# Patient Record
Sex: Female | Born: 1955
Health system: Southern US, Community
[De-identification: ages and names within clinical notes are randomized; demographics above are authoritative.]

## PROBLEM LIST (undated history)

## (undated) DIAGNOSIS — N390 Urinary tract infection, site not specified: Secondary | ICD-10-CM

## (undated) DIAGNOSIS — H04123 Dry eye syndrome of bilateral lacrimal glands: Secondary | ICD-10-CM

## (undated) DIAGNOSIS — B019 Varicella without complication: Secondary | ICD-10-CM

## (undated) DIAGNOSIS — M2550 Pain in unspecified joint: Secondary | ICD-10-CM

## (undated) DIAGNOSIS — IMO0002 Reserved for concepts with insufficient information to code with codable children: Secondary | ICD-10-CM

## (undated) DIAGNOSIS — E0500 Thyrotoxicosis with diffuse goiter without thyrotoxic crisis or storm: Secondary | ICD-10-CM

## (undated) DIAGNOSIS — R002 Palpitations: Secondary | ICD-10-CM

## (undated) DIAGNOSIS — I499 Cardiac arrhythmia, unspecified: Secondary | ICD-10-CM

## (undated) DIAGNOSIS — E039 Hypothyroidism, unspecified: Secondary | ICD-10-CM

## (undated) DIAGNOSIS — E079 Disorder of thyroid, unspecified: Secondary | ICD-10-CM

## (undated) DIAGNOSIS — M797 Fibromyalgia: Secondary | ICD-10-CM

## (undated) HISTORY — DX: Thyrotoxicosis with diffuse goiter without thyrotoxic crisis or storm: E05.00

## (undated) HISTORY — PX: WISDOM TOOTH EXTRACTION: SHX21

## (undated) HISTORY — DX: Urinary tract infection, site not specified: N39.0

## (undated) HISTORY — DX: Reserved for concepts with insufficient information to code with codable children: IMO0002

## (undated) HISTORY — DX: Varicella without complication: B01.9

## (undated) HISTORY — DX: Palpitations: R00.2

## (undated) HISTORY — DX: Fibromyalgia: M79.7

## (undated) HISTORY — DX: Pain in unspecified joint: M25.50

## (undated) HISTORY — DX: Dry eye syndrome of bilateral lacrimal glands: H04.123

## (undated) HISTORY — DX: Disorder of thyroid, unspecified: E07.9

## (undated) HISTORY — DX: Cardiac arrhythmia, unspecified: I49.9

## (undated) HISTORY — DX: Hypothyroidism, unspecified: E03.9

---

## 2007-12-18 LAB — HM COLONOSCOPY: HM Colonoscopy: NORMAL

## 2009-12-17 LAB — HM MAMMOGRAPHY: HM Mammogram: NORMAL

## 2011-12-18 LAB — HM PAP SMEAR: HM Pap smear: NORMAL

## 2013-09-10 ENCOUNTER — Encounter: Payer: Self-pay | Admitting: Family Medicine

## 2013-09-10 ENCOUNTER — Ambulatory Visit (INDEPENDENT_AMBULATORY_CARE_PROVIDER_SITE_OTHER): Payer: 59 | Admitting: Family Medicine

## 2013-09-10 VITALS — BP 100/78 | Temp 98.2°F | Ht 68.0 in | Wt 167.0 lb

## 2013-09-10 DIAGNOSIS — H04123 Dry eye syndrome of bilateral lacrimal glands: Secondary | ICD-10-CM

## 2013-09-10 DIAGNOSIS — IMO0001 Reserved for inherently not codable concepts without codable children: Secondary | ICD-10-CM

## 2013-09-10 DIAGNOSIS — E039 Hypothyroidism, unspecified: Secondary | ICD-10-CM

## 2013-09-10 DIAGNOSIS — M797 Fibromyalgia: Secondary | ICD-10-CM

## 2013-09-10 DIAGNOSIS — R002 Palpitations: Secondary | ICD-10-CM

## 2013-09-10 DIAGNOSIS — H04129 Dry eye syndrome of unspecified lacrimal gland: Secondary | ICD-10-CM

## 2013-09-10 HISTORY — DX: Fibromyalgia: M79.7

## 2013-09-10 HISTORY — DX: Palpitations: R00.2

## 2013-09-10 HISTORY — DX: Dry eye syndrome of bilateral lacrimal glands: H04.123

## 2013-09-10 HISTORY — DX: Hypothyroidism, unspecified: E03.9

## 2013-09-10 NOTE — Progress Notes (Signed)
Pre visit review using our clinic review tool, if applicable. No additional management support is needed unless otherwise documented below in the visit note. 

## 2013-09-10 NOTE — Progress Notes (Signed)
Chief Complaint  Patient presents with  . Establish Care    HPI:  Brittany Moran is here to establish care. Recently moved here from [place]. Last PCP and physical: has had female physical in last few years  Hx of graves, polyarthralgia and numbness in legs: -was seeing and integrative medicine doctor in [place] most recently -dx with graves in 2000 and had RAI ablation -was on synthroid for a long time then put on cytomel too 1 year ago by integrative medical doctor and she doesn't know if needs to take this and only taking it occ -7 years ago had polyarthralgia and had random numbness in L leg that stopped her marathon running and she saw rheumatologist, sports medicine and neurologist without help -went to [facility] in [place] in 2.2013  - saw internist whom gave dx of "central sensitization disorder" and was referred to physiatrist and was told has fibromyalgia and was started on savella which had helped the pain some -saw documentary "forks over knives"  and went to plant based diet about 6 months ago and now is doing great with pain gone and is thinking about restarting exercising -has been on stable dose of synthroid for a long time -she has been weaning off the cytomel and the sevella and is hardly taking and feels great  Has the following chronic problems and concerns today:  Patient Active Problem List   Diagnosis Date Noted  . Hypothyroidism, acquired 09/10/2013  . Palpitations 09/10/2013  . Fibromyalgia 09/10/2013  . Dry eyes 09/10/2013   Health Maintenance: -needs health maintenance physical  ROS: See pertinent positives and negatives per HPI.  Past Medical History  Diagnosis Date  . Chicken pox   . Cardiac arrhythmia   . Thyroid disease   . UTI (urinary tract infection)   . Graves disease   . Polyarthralgia   . DDD (degenerative disc disease)     Family History  Problem Relation Age of Onset  . Arthritis Mother   . Hyperlipidemia Mother   . Heart disease  Father   . Sudden death Maternal Grandmother   . Sudden death Paternal Grandfather   . Hypertension Mother   . Bipolar disorder Father   . Schizophrenia Brother   . Depression      brothers and sisters   . Diabetes Mother     History   Social History  . Marital Status: Married    Spouse Name: N/A    Number of Children: N/A  . Years of Education: N/A   Social History Main Topics  . Smoking status: Never Smoker   . Smokeless tobacco: None  . Alcohol Use: Yes     Comment: socially   . Drug Use: None  . Sexual Activity: None   Other Topics Concern  . 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 4 times a week,  Disp: , Rfl: ;  cycloSPORINE (RESTASIS) 0.05 % ophthalmic emulsion, 1 drop 2 (two) times daily., Disp: , Rfl:  Ginger, Zingiber officinalis, (GINGER PO), Take by mouth daily., Disp: , Rfl: ;  levothyroxine (SYNTHROID, LEVOTHROID) 125 MCG tablet, Take 125 mcg by mouth daily before breakfast., Disp: , Rfl: ;  liothyronine (CYTOMEL) 25 MCG tablet, Take by mouth daily., Disp: , Rfl: ;  Milnacipran (SAVELLA) 50 MG TABS tablet, Take 50 mg by mouth 2 (two) times daily., Disp: , Rfl:  Multiple Vitamins-Minerals (ZINC PO), Take by mouth. 3 to 4 times a week, Disp: , Rfl:   EXAM:  Filed Vitals:   09/10/13 1616  BP: 100/78  Temp: 98.2 F (36.8 C)    Body mass index is 25.4 kg/(m^2).  GENERAL: vitals reviewed and listed above, alert, oriented, appears well hydrated and in no acute  distress  HEENT: atraumatic, conjunttiva clear, no obvious abnormalities on inspection of external nose and ears  NECK: no obvious masses on inspection  LUNGS: clear to auscultation bilaterally, no wheezes, rales or rhonchi, good air movement  CV: HRRR, no peripheral edema  MS: moves all extremities without noticeable abnormality  PSYCH: pleasant and cooperative, no obvious depression or anxiety  ASSESSMENT AND PLAN:  Discussed the following assessment and plan:  Hypothyroidism, acquired -is going to wean off cytomel and check thyroid labs at physical  Palpitations  Fibromyalgia -is going to wean of savella   Dry eyes  -We reviewed the PMH, PSH, FH, SH, Meds and Allergies. -We provided refills for any medications we will prescribe as needed. -We addressed current concerns per orders and patient instructions. -We have asked for records for pertinent exams, studies, vaccines and notes from previous providers. -We have advised patient to follow up per instructions below. -follow up for physical and labs in 3 months  -Patient advised to return or notify a doctor immediately if symptoms worsen or persist or new concerns arise.  Patient Instructions  -We have ordered labs or studies at this visit. It can take up to 1-2 weeks for results and processing. We will contact you with instructions IF your results are abnormal. Normal results will be released to your [facility]. If you have not heard from us or can not find your results in [facility] in 2 weeks please contact our office.  -PLEASE SIGN UP FOR MYCHART TODAY   We recommend the following healthy lifestyle measures: - eat a healthy diet consisting of lots of vegetables, fruits, beans, nuts, seeds, healthy meats such as white chicken and fish and whole grains.  - avoid fried foods, fast food, processed foods, sodas, red meet and other fattening foods.  - get a least 150 minutes of aerobic exercise per week.   Follow up in: 3  months for physical and we will do labs that morning       KIM, HANNAH R.

## 2013-09-10 NOTE — Patient Instructions (Signed)
-  We have ordered labs or studies at this visit. It can take up to 1-2 weeks for results and processing. We will contact you with instructions IF your results are abnormal. Normal results will be released to your [facility]. If you have not heard from [place] or can not find your results in [facility] in 2 weeks please contact our office.  -PLEASE SIGN UP FOR MYCHART TODAY   We recommend the following healthy lifestyle measures: - eat a healthy diet consisting of lots of vegetables, fruits, beans, nuts, seeds, healthy meats such as white chicken and fish and whole grains.  - avoid fried foods, fast food, processed foods, sodas, red meet and other fattening foods.  - get a least 150 minutes of aerobic exercise per week.   Follow up in: 3 months for physical and we will do labs that morning

## 2013-10-18 ENCOUNTER — Telehealth: Payer: Self-pay

## 2013-10-18 MED ORDER — LEVOTHYROXINE SODIUM 125 MCG PO TABS: 125.0000 ug | ORAL_TABLET | Freq: Every day | ORAL | Status: DC

## 2013-10-18 NOTE — Telephone Encounter (Signed)
Pt is req refill on levothyroxine (SYNTHROID, LEVOTHROID) 125 MCG tablet  She said she can not take the generic levothyroxine she has to have synthroid  ///   Pharmacy is [facility] on [address]

## 2013-10-18 NOTE — Telephone Encounter (Signed)
Rx sent for 90 days; pt has upcoming appointment next month.

## 2013-12-03 ENCOUNTER — Ambulatory Visit: Payer: 59 | Admitting: Family Medicine

## 2014-01-22 ENCOUNTER — Other Ambulatory Visit: Payer: Self-pay | Admitting: Family Medicine

## 2014-01-28 ENCOUNTER — Telehealth: Payer: Self-pay | Admitting: Family Medicine

## 2014-01-28 MED ORDER — LEVOTHYROXINE SODIUM 125 MCG PO TABS: 125.0000 ug | ORAL_TABLET | Freq: Every day | ORAL | Status: DC

## 2014-01-28 NOTE — Telephone Encounter (Signed)
Pt is needing new rx for levothyroxine (SYNTHROID, LEVOTHROID) 125 MCG tablet Send to [facility] [address]

## 2014-02-12 ENCOUNTER — Encounter: Payer: Self-pay | Admitting: Family Medicine

## 2014-02-12 ENCOUNTER — Ambulatory Visit (INDEPENDENT_AMBULATORY_CARE_PROVIDER_SITE_OTHER): Payer: 59 | Admitting: Family Medicine

## 2014-02-12 ENCOUNTER — Encounter: Payer: Self-pay | Admitting: *Deleted

## 2014-02-12 VITALS — BP 104/80 | HR 63 | Temp 98.1°F | Ht 68.25 in | Wt 162.0 lb

## 2014-02-12 DIAGNOSIS — IMO0001 Reserved for inherently not codable concepts without codable children: Secondary | ICD-10-CM

## 2014-02-12 DIAGNOSIS — M797 Fibromyalgia: Secondary | ICD-10-CM

## 2014-02-12 DIAGNOSIS — Z0000 Encounter for general adult medical examination without abnormal findings: Secondary | ICD-10-CM

## 2014-02-12 DIAGNOSIS — E039 Hypothyroidism, unspecified: Secondary | ICD-10-CM

## 2014-02-12 DIAGNOSIS — R002 Palpitations: Secondary | ICD-10-CM

## 2014-02-12 LAB — LIPID PANEL
CHOL/HDL RATIO: 3
Cholesterol: 187 mg/dL (ref 0–200)
HDL: 71.4 mg/dL (ref >39.00)
LDL CALC: 105 mg/dL — AB (ref 0–99)
NonHDL: 115.6
TRIGLYCERIDES: 55 mg/dL (ref 0.0–149.0)
VLDL: 11 mg/dL (ref 0.0–40.0)

## 2014-02-12 LAB — HEMOGLOBIN A1C: Hgb A1c MFr Bld: 5.1 % (ref 4.6–6.5)

## 2014-02-12 LAB — TSH: TSH: 0.61 u[IU]/mL (ref 0.35–4.50)

## 2014-02-12 NOTE — Patient Instructions (Addendum)
-  Vit D3 1000 IU daily; a total of 1200mg  calcium from diet + or - supplement  -schedule gyn appointment and mammogram  -We recommend the following healthy lifestyle measures: - eat a healthy diet consisting of lots of vegetables, fruits, beans, nuts, seeds, healthy meats such as white chicken and fish and whole grains.  - avoid fried foods, fast food, processed foods, sodas, red meet and other fattening foods.  - get a least 150 minutes of aerobic exercise per week.   -We have ordered labs or studies at this visit. It can take up to 1-2 weeks for results and processing. We will contact you with instructions IF your results are abnormal. Normal results will be released to your [facility]. If you have not heard from us or can not find your results in [facility] in 2 weeks please contact our office.  Follow up in 6 months and as needed

## 2014-02-12 NOTE — Progress Notes (Signed)
No chief complaint on file.   HPI:  Here for CPE:  -Concerns and/or follow up today: none  Hypothyroidism: -dx graves 2000, s/p RAI ablation -on cytomel in the past with integrative doctor - was weaning off of this last visit -meds: synthroid -reports: doing well -denies: palpitations, hair or skin changes, unintentional changes in weight  Hx Palbitations: -meds: asa, she stopped the atenolol in 11/2013 -reports:doing well -denies: CP, swelling, SOB  Hx Central Sensitization Disorder/Fibromyalgia: -on savella in the past, weaning off after doing much better on plant based diet -stopped savella in 11/2013  -Diet: variety of foods, balance and well rounded, plant based diet  -Exercise: regular exercise  -Taking folic acid, vitamin D or calcium: no  -Diabetes and Dyslipidemia Screening: doing today  -Hx of HTN: no  -Vaccines: UTD  -pap history: remote hx of abnormal pap - going to see gyn for this  -FDLMP: n/a  -sexual activity: yes, female partner, no new partners  -wants STI testing: no  -FH breast, colon or ovarian ca: see FH Last mammogram: 2 years ago, going to do this with gyn Last colon cancer screening: reports normal colonoscopy in   -Alcohol, Tobacco, drug use: see social history  Review of Systems - no fevers, unintentional weight loss, vision loss, hearing loss, chest pain, sob, hemoptysis, melena, hematochezia, hematuria, genital discharge, changing or concerning skin lesions, bleeding, bruising, loc, thoughts of self harm or SI  Past Medical History  Diagnosis Date  . Chicken pox   . Cardiac arrhythmia   . Thyroid disease   . UTI (urinary tract infection)   . Graves disease   . Polyarthralgia   . DDD (degenerative disc disease)     No past surgical history on file.  Family History  Problem Relation Age of Onset  . Arthritis Mother   . Hyperlipidemia Mother   . Heart disease Father   . Sudden death Maternal Grandmother   . Sudden  death Paternal Grandfather   . Hypertension Mother   . Bipolar disorder Father   . Schizophrenia Brother   . Depression      brothers and sisters   . Diabetes Mother     History   Social History  . Marital Status: Married    Spouse Name: N/A    Number of Children: N/A  . Years of Education: N/A   Social History Main Topics  . Smoking status: Never Smoker   . Smokeless tobacco: None  . Alcohol Use: Yes     Comment: socially   . Drug Use: None  . Sexual Activity: None   Other Topics Concern  . 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[employer]      Home Situation: lives with husband      Spiritual Beliefs: Christian      Lifestyle: walking on a regular basis; eating a healthy vegetarian diet                   Current outpatient prescriptions:aspirin 81 MG tablet, Take 81 mg by mouth daily., Disp: , Rfl: ;  CALCIUM PO, Take by mouth daily., Disp: , Rfl: ;  cycloSPORINE (RESTASIS) 0.05 % ophthalmic emulsion, 1 drop 2 (two) times daily., Disp: , Rfl: ;  Ginger, Zingiber officinalis, (GINGER PO), Take by mouth daily., Disp: , Rfl:  levothyroxine (SYNTHROID, LEVOTHROID) 125 MCG tablet, Take 1 tablet (125 mcg total) by mouth daily before breakfast., Disp: 90 tablet, Rfl:  0  EXAM:  Filed Vitals:   02/12/14 0827  BP: 104/80  Pulse: 63  Temp: 98.1 F (36.7 C)    GENERAL: vitals reviewed and listed below, alert, oriented, appears well hydrated and in no acute distress  HEENT: head atraumatic, PERRLA, normal appearance of eyes, ears, nose and mouth. moist mucus membranes.  NECK: supple, no masses or lymphadenopathy  LUNGS: clear to auscultation bilaterally, no rales, rhonchi or wheeze  CV: HRRR, no peripheral edema or cyanosis, normal pedal pulses  BREAST: declined - seeing gyn for this  ABDOMEN: bowel sounds normal, soft, non tender to palpation, no masses, no rebound or guarding  GU: declined - seeing gyn for  this  RECTAL: refused  SKIN: no rash or abnormal lesions  MS: normal gait, moves all extremities normally  NEURO: CN II-XII grossly intact, normal muscle strength and sensation to light touch on extremities  PSYCH: normal affect, pleasant and cooperative  ASSESSMENT AND PLAN:  Discussed the following assessment and plan:  Visit for preventive health examination - Plan: Lipid Panel, Hemoglobin A1c  Hypothyroidism, acquired - Plan: TSH  Palpitations  Fibromyalgia  -Discussed and advised all [place] preventive services health task force level A and B recommendations for age, sex and risks.  -Advised at least 150 minutes of exercise per week and a healthy diet low in saturated fats and sweets and consisting of fresh fruits and vegetables, lean meats such as fish and white chicken and whole grains.  -FASTING labs, studies and vaccines per orders this encounter  Orders Placed This Encounter  Procedures  . Lipid Panel  . Hemoglobin A1c  . TSH    Patient advised to return to clinic immediately if symptoms worsen or persist or new concerns.    Return in about 6 months (around 08/15/2014) for follow up.  Kriste BasqueKIM, Maleah Rabago R.

## 2014-02-12 NOTE — Progress Notes (Signed)
Pre visit review using our clinic review tool, if applicable. No additional management support is needed unless otherwise documented below in the visit note. 

## 2014-02-26 ENCOUNTER — Telehealth: Payer: Self-pay | Admitting: Family Medicine

## 2014-02-26 DIAGNOSIS — E039 Hypothyroidism, unspecified: Secondary | ICD-10-CM

## 2014-02-26 NOTE — Telephone Encounter (Signed)
Pt states she had talked with dr. Selena BattenKim about seeing a endocrinologist based on her last thyroid test. Pt is requesting the referral now, pt has [facility].

## 2014-02-27 NOTE — Telephone Encounter (Signed)
Order was placed for referral to endocrinologist and I called the pt and left a detailed message at her home number someone from our office will call her with appt information within a week or two.

## 2014-02-27 NOTE — Telephone Encounter (Signed)
Ok

## 2014-03-22 ENCOUNTER — Ambulatory Visit: Payer: 59 | Admitting: Internal Medicine

## 2014-04-04 ENCOUNTER — Encounter: Payer: Self-pay | Admitting: Internal Medicine

## 2014-04-04 ENCOUNTER — Ambulatory Visit (INDEPENDENT_AMBULATORY_CARE_PROVIDER_SITE_OTHER): Payer: 59 | Admitting: Internal Medicine

## 2014-04-04 VITALS — BP 112/76 | HR 60 | Temp 98.0°F | Resp 12 | Ht 67.75 in | Wt 164.6 lb

## 2014-04-04 DIAGNOSIS — E039 Hypothyroidism, unspecified: Secondary | ICD-10-CM

## 2014-04-04 DIAGNOSIS — Z789 Other specified health status: Secondary | ICD-10-CM

## 2014-04-04 MED ORDER — SYNTHROID 125 MCG PO TABS: 125.0000 ug | ORAL_TABLET | Freq: Every day | ORAL | Status: DC

## 2014-04-04 NOTE — Progress Notes (Signed)
Patient ID: Brittany Moran, female   DOB: 08-17-1955, 58 y.o.   MRN: [id number]   HPI  Brittany Moran is a 58 y.o.-year-old female, referred by her PCP, Dr. Selena Batten, for management of hypothyroidism. She recently moved from [place].  Pt. has been dx with Graves ds. in 2001 - s/p RAI Tx in 2005. She developed hypothyroidism in 2001; is on Synthroid DAW 125 mcg - saw [facility] >> LT3 added - but she stopped in 10/2013 as her TSH was suppressed.  She takes the LT4: - fasting - with water - separated by >30 min from b'fast   I reviewed pt's thyroid tests: Lab Results  Component Value Date   TSH 0.61 02/12/2014    Pt describes: - fatigue - + weight loss - + cold intolerance (not new) - no constipation - dry skin - + hair falling - no depression/anxiety  Pt denies feeling nodules in neck, hoarseness, dysphagia/odynophagia, SOB with lying down.  She has + FH of thyroid disorders in: mother, 2 sisters. No FH of thyroid cancer. Has FH of autoimmune disorders in sister. No h/o radiation tx to head or neck except RAI tx.  No recent use of iodine supplements.  I reviewed her chart and she also has a history of an uncharacterized autoimmune disorder - has been investigated extensively, finally dx'ed with fibromyalgia - got better after starting vegan diet (she eats fish once a week). She also has MTFHR def. >> started Me-cobalamine, but now stopped for several months.   ROS: Constitutional: no weight gain/loss, no fatigue, + subjective hyperthermia (hot flushes), + poor sleep  Eyes: no blurry vision, no xerophthalmia ENT: no sore throat, no nodules palpated in throat, no dysphagia/odynophagia, no hoarseness Cardiovascular: no CP/SOB/+ palpitations/no leg swelling Respiratory: no cough/SOB Gastrointestinal: no N/V/D/C Musculoskeletal: no muscle/+ joint aches - resolved Skin: no rashes, + hair loss Neurological: no tremors/numbness/tingling/dizziness Psychiatric: no  depression/anxiety + low libido  Past Medical History  Diagnosis Date  . Chicken pox   . Cardiac arrhythmia   . Thyroid disease   . UTI (urinary tract infection)   . Graves disease   . Polyarthralgia   . DDD (degenerative disc disease)    History reviewed. No pertinent past surgical history. History   Social History  . Marital Status: Married    Spouse Name: N/A    Number of Children: 2   Social History Main Topics  . Smoking status: Never Smoker   . Smokeless tobacco: Never Used  . Alcohol Use: 0.5 oz/week    1 drink(s) per week     Comment: socially   . 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 aspirin 81 MG tablet Take 81 mg by mouth daily.      Marland Kitchen CALCIUM PO Take by mouth daily.      . cycloSPORINE (RESTASIS) 0.05 % ophthalmic emulsion 1 drop 2 (two) times daily.      . Ginger, Zingiber officinalis, (GINGER PO) Take by mouth daily.       No current facility-administered  medications on file prior to visit.   No Known Allergies Family History  Problem Relation Age of Onset  . Arthritis Mother   . Hyperlipidemia Mother   . Hypertension Mother   . Diabetes Mother   . Heart disease Father   . Bipolar disorder Father   . Hypertension Father   . Hyperlipidemia Father   . Sudden death Maternal Grandmother   . Sudden death Paternal Grandfather   . Schizophrenia Brother   . Depression      brothers and sisters    PE: BP 112/76  Pulse 60  Temp(Src) 98 F (36.7 C) (Oral)  Resp 12  Ht 5' 7.75" (1.721 m)  Wt 164 lb 9.6 oz (74.662 kg)  BMI 25.21 kg/m2  SpO2 96% Wt  Readings from Last 3 Encounters:  04/04/14 164 lb 9.6 oz (74.662 kg)  02/12/14 162 lb (73.483 kg)  09/10/13 167 lb (75.751 kg)   Constitutional: normal weight, in NAD Eyes: PERRLA, EOMI, no exophthalmos ENT: moist mucous membranes, no thyromegaly, no cervical lymphadenopathy Cardiovascular: RRR, No MRG Respiratory: CTA B Gastrointestinal: abdomen soft, NT, ND, BS+ Musculoskeletal: no deformities, strength intact in all 4 Skin: moist, warm, no rashes Neurological: no tremor with outstretched hands, DTR normal in all 4  ASSESSMENT: 1. Hypothyroidism  2. Vegan diet  PLAN:  1. Patient with long-standing hypothyroidism, on levothyroxine (DAW Synthroid) therapy. She appears euthyroid. She does not appear to have a goiter, thyroid nodules, or neck compression symptoms - We discussed about correct intake of levothyroxine, fasting, with water, separated by at least 30 minutes from breakfast, and separated by more than 4 hours from calcium, iron, multivitamins, acid reflux medications (PPIs). - discussed about TSH targets. Explained the role of T4 and T3. Advised her to stay off LT3 - will check thyroid tests today: TSH, free T4, free T3 - If these are abnormal, she will need to return in 6-8 weeks for repeat labs - If these are normal, I will see her back in 6 months  2. Vegan diet - she stopped her B12 vitamin during her move to [place] - advised to restart (Methylated B12 since MTFHR mut.). This can be the cause for her previous generalized sxs and also for her current hair loss - suggested several references for vegan diet (see pt instructions)  Office Visit on 04/04/2014  Component Date Value Ref Range Status  . TSH 04/04/2014 0.96  0.35 - 4.50 uIU/mL Final  . Free T4 04/04/2014 1.13  0.60 - 1.60 ng/dL Final  . T3, Free 16/04/9603 2.6  2.3 - 4.2 pg/mL Final   Thyroid tests are normal.

## 2014-04-04 NOTE — Patient Instructions (Signed)
Please stop at the lab. Continue the Synthroid 125 mcg for now.  Please start Methyl-cobalamine = Me-B12 (from [facility]) 1 tablet daily.  Plant-based materials:  Lequita Asal: "Program for Reversing Diabetes"  Ferol Luz: "The [place] Study"  Konrad Penta: "Supermarket Vegan" (cookbook)  Please return in 6 months.

## 2014-04-05 LAB — T4, FREE: FREE T4: 1.13 ng/dL (ref 0.60–1.60)

## 2014-04-05 LAB — TSH: TSH: 0.96 u[IU]/mL (ref 0.35–4.50)

## 2014-04-05 LAB — T3, FREE: T3, Free: 2.6 pg/mL (ref 2.3–4.2)

## 2014-04-23 ENCOUNTER — Other Ambulatory Visit: Payer: Self-pay | Admitting: Family Medicine

## 2014-04-23 NOTE — Telephone Encounter (Signed)
Dr Ferdinand LangoKim-looks like Dr Elvera LennoxGherghe gave the pt a refill on 9/17 #60 with 3 refills??

## 2014-04-24 ENCOUNTER — Telehealth: Payer: Self-pay | Admitting: Internal Medicine

## 2014-04-24 MED ORDER — SYNTHROID 125 MCG PO TABS: 125.0000 ug | ORAL_TABLET | Freq: Every day | ORAL | Status: DC

## 2014-04-24 NOTE — Telephone Encounter (Signed)
synthryiod 125 mg 1 tab daily [facility] call in to [facility] thank you

## 2014-07-31 ENCOUNTER — Other Ambulatory Visit: Payer: Self-pay | Admitting: Family Medicine

## 2014-08-05 ENCOUNTER — Telehealth: Payer: Self-pay | Admitting: Internal Medicine

## 2014-08-05 ENCOUNTER — Other Ambulatory Visit: Payer: Self-pay | Admitting: *Deleted

## 2014-08-05 MED ORDER — SYNTHROID 125 MCG PO TABS: 125.0000 ug | ORAL_TABLET | Freq: Every day | ORAL | Status: DC

## 2014-08-05 NOTE — Telephone Encounter (Signed)
Done

## 2014-08-05 NOTE — Telephone Encounter (Signed)
Patient need a prescription for synthroid 125 mg

## 2014-10-03 ENCOUNTER — Ambulatory Visit: Payer: 59 | Admitting: Internal Medicine

## 2014-10-07 ENCOUNTER — Telehealth: Payer: Self-pay | Admitting: Family Medicine

## 2014-10-07 DIAGNOSIS — R234 Changes in skin texture: Secondary | ICD-10-CM

## 2014-10-07 NOTE — Telephone Encounter (Signed)
Pt said she has had a spot on her fore head and it is very scaly and is asking for a referral to see a dematologist.

## 2014-10-08 NOTE — Telephone Encounter (Signed)
ok 

## 2014-10-08 NOTE — Telephone Encounter (Signed)
Order placed and I left a detailed message at the pts cell number the referral was placed and someone will call her with appt information.

## 2014-11-08 ENCOUNTER — Telehealth: Payer: Self-pay | Admitting: Internal Medicine

## 2014-11-08 ENCOUNTER — Other Ambulatory Visit: Payer: Self-pay | Admitting: *Deleted

## 2014-11-08 MED ORDER — SYNTHROID 125 MCG PO TABS: 125.0000 ug | ORAL_TABLET | Freq: Every day | ORAL | Status: DC

## 2014-11-08 NOTE — Telephone Encounter (Signed)
Pt needs refill on synthryoid rx

## 2014-11-08 NOTE — Telephone Encounter (Signed)
Pt needs refill on synthyroid

## 2014-11-11 ENCOUNTER — Other Ambulatory Visit: Payer: Self-pay | Admitting: *Deleted

## 2014-11-11 MED ORDER — SYNTHROID 125 MCG PO TABS: 125.0000 ug | ORAL_TABLET | Freq: Every day | ORAL | Status: DC

## 2014-11-11 NOTE — Telephone Encounter (Signed)
    Expand All Collapse All   Pt needs refill on synthroid she is totally out,

## 2014-11-15 ENCOUNTER — Encounter: Payer: Self-pay | Admitting: Internal Medicine

## 2014-11-15 ENCOUNTER — Ambulatory Visit (INDEPENDENT_AMBULATORY_CARE_PROVIDER_SITE_OTHER): Payer: Self-pay | Admitting: Internal Medicine

## 2014-11-15 VITALS — BP 102/60 | HR 61 | Temp 97.7°F | Resp 12 | Wt 172.6 lb

## 2014-11-15 DIAGNOSIS — E039 Hypothyroidism, unspecified: Secondary | ICD-10-CM

## 2014-11-15 NOTE — Patient Instructions (Signed)
Please separate the calcium from Levothyroxine by >4h.  Come back for labs in 5-6 weeks.  Please return in 1 year.

## 2014-11-15 NOTE — Progress Notes (Signed)
Patient ID: Brittany CanterburyJeanette Pouncey, female   DOB: Oct 05, 1955, 59 y.o.   MRN: [id number]   HPI  Brittany Moran is a 59 y.o.-year-old female,returning for f/u for hypothyroidism. Last visit 6 mo ago.  Reviewed hx: Pt. has been dx with Graves ds. in 2001 - s/p RAI Tx in 2005. She developed hypothyroidism in 2001; is on Synthroid DAW 125 mcg - [facility] >> LT3 added - but she stopped in 10/2013 as her TSH was suppressed.  She takes the Synthroid: - fasting - with water - separated by 30 min-1h from b'fast  - + calcium with breakfast! - no MVI - no PPI  I reviewed pt's thyroid tests: Lab Results  Component Value Date   TSH 0.96 04/04/2014   TSH 0.61 02/12/2014   FREET4 1.13 04/04/2014    Pt describes: - fatigue - + weight gain - + cold intolerance (not new) - no constipation - dry skin - no hair loss - no depression/anxiety  Pt denies feeling nodules in neck, hoarseness, dysphagia/odynophagia, SOB with lying down.  I reviewed her chart and she also has a history of an uncharacterized autoimmune disorder - has been investigated extensively, finally dx'ed with fibromyalgia - got better after starting vegan diet (she eats fish once a week). She also has MTFHR def. >> started Me-cobalamine, but now stopped for several months.   ROS: Constitutional: no weight gain/loss, no fatigue, + subjective hyperthermia (hot flushes), + poor sleep  Eyes: no blurry vision, no xerophthalmia ENT: no sore throat, no nodules palpated in throat, no dysphagia/odynophagia, no hoarseness Cardiovascular: no CP/SOB/palpitations/no leg swelling Respiratory: no cough/SOB Gastrointestinal: no N/V/D/C Musculoskeletal: no muscle/joint aches Skin: no rashes Neurological: no tremors/numbness/tingling/dizziness  Past Medical History  Diagnosis Date  . Chicken pox   . Cardiac arrhythmia   . Thyroid disease   . UTI (urinary tract infection)   . Graves disease   . Polyarthralgia   . DDD  (degenerative disc disease)    No past surgical history on file. History   Social History  . Marital Status: Married    Spouse Name: N/A    Number of Children: 2   Social History Main Topics  . Smoking status: Never Smoker   . Smokeless tobacco: Never Used  . Alcohol Use: 0.5 oz/week    1 drink(s) per week     Comment: socially   . 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 CALCIUM PO Take by mouth daily.    . Cholecalciferol (VITAMIN D-3 PO) Take 1 capsule by mouth daily.    . cycloSPORINE (RESTASIS) 0.05 % ophthalmic emulsion 1 drop 2 (two) times daily.    Marland Kitchen. SYNTHROID 125 MCG tablet Take 1 tablet (125 mcg total) by mouth daily before breakfast. 30 tablet 1  . aspirin 81 MG tablet Take 81 mg by mouth daily.    . Ginger, Zingiber officinalis, (GINGER PO) Take by mouth daily.     No current facility-administered medications on file prior to visit.   No Known Allergies Family History  Problem Relation Age of Onset  . Arthritis Mother   . Hyperlipidemia Mother   . Hypertension Mother   . Diabetes Mother   . Heart disease Father   . Bipolar disorder Father   . Hypertension Father   . Hyperlipidemia Father   . Sudden death Maternal Grandmother   . Sudden death Paternal Grandfather   . Schizophrenia Brother   . Depression      brothers and sisters    PE: BP 102/60 mmHg  Pulse 61  Temp(Src) 97.7 F (36.5 C) (Oral)  Resp 12  Wt 172 lb 9.6 oz (78.291 kg)  SpO2 98% Wt Readings from Last 3 Encounters:  11/15/14 172 lb 9.6 oz (78.291 kg)  04/04/14 164 lb 9.6 oz (74.662 kg)   02/12/14 162 lb (73.483 kg)   Constitutional: normal weight, in NAD Eyes: PERRLA, EOMI, no exophthalmos ENT: moist mucous membranes, no thyromegaly, no cervical lymphadenopathy Cardiovascular: RRR, No MRG Respiratory: CTA B Gastrointestinal: abdomen soft, NT, ND, BS+ Musculoskeletal: no deformities, strength intact in all 4 Skin: moist, warm, no rashes Neurological: no tremor with outstretched hands, DTR normal in all 4  ASSESSMENT: 1. Hypothyroidism  PLAN:  1. Patient with long-standing hypothyroidism, on levothyroxine (DAW Synthroid) therapy. She appears euthyroid. She does not appear to have a goiter, thyroid nodules, or neck compression symptoms - We discussed about correct intake of levothyroxine, fasting, with water, separated by at least 30 minutes from breakfast, and separated by more than 4 hours from calcium, iron, multivitamins, acid reflux medications (PPIs). She is taking calcium 1h after the Synthroid. Advised her to move it later in the day. - she skipped the LT4 for 2 days this week (had run out) - will check thyroid tests in 1 mo: TSH, free T4 after she starts separating it from calcium and does not miss doses. - return in 1 year.  Orders Placed This Encounter  Procedures  . T3, free  . T4, free  . TSH

## 2014-12-27 ENCOUNTER — Other Ambulatory Visit (INDEPENDENT_AMBULATORY_CARE_PROVIDER_SITE_OTHER): Payer: 59

## 2014-12-27 DIAGNOSIS — E039 Hypothyroidism, unspecified: Secondary | ICD-10-CM

## 2014-12-27 LAB — T3, FREE: T3 FREE: 2.7 pg/mL (ref 2.3–4.2)

## 2014-12-27 LAB — TSH: TSH: 0.59 u[IU]/mL (ref 0.35–4.50)

## 2014-12-30 LAB — T4, FREE: Free T4: 1.14 ng/dL (ref 0.60–1.60)

## 2015-01-13 ENCOUNTER — Other Ambulatory Visit: Payer: Self-pay | Admitting: Internal Medicine

## 2015-02-04 ENCOUNTER — Ambulatory Visit (INDEPENDENT_AMBULATORY_CARE_PROVIDER_SITE_OTHER): Payer: 59 | Admitting: Physician Assistant

## 2015-02-04 VITALS — BP 106/70 | HR 80 | Temp 97.7°F | Resp 18 | Ht 68.0 in | Wt 176.5 lb

## 2015-02-04 DIAGNOSIS — R300 Dysuria: Secondary | ICD-10-CM | POA: Diagnosis not present

## 2015-02-04 DIAGNOSIS — R350 Frequency of micturition: Secondary | ICD-10-CM

## 2015-02-04 LAB — POCT UA - MICROSCOPIC ONLY
CASTS, UR, LPF, POC: NEGATIVE
Crystals, Ur, HPF, POC: NEGATIVE
Mucus, UA: NEGATIVE
YEAST UA: NEGATIVE

## 2015-02-04 LAB — POCT URINALYSIS DIPSTICK
Bilirubin, UA: NEGATIVE
Glucose, UA: NEGATIVE
Ketones, UA: NEGATIVE
Nitrite, UA: NEGATIVE
Protein, UA: NEGATIVE
Spec Grav, UA: <=1.005
Urobilinogen, UA: 0.2
pH, UA: 6.5

## 2015-02-04 MED ORDER — NITROFURANTOIN MONOHYD MACRO 100 MG PO CAPS: 100.0000 mg | ORAL_CAPSULE | Freq: Two times a day (BID) | ORAL | Status: DC

## 2015-02-04 NOTE — Patient Instructions (Signed)
Please take the macrobid twice daily for 5 days. Please come back to see us if the symptoms are persisting after 5 days.   Urinary Tract Infection Urinary tract infections (UTIs) can develop anywhere along your urinary tract. Your urinary tract is your body's drainage system for removing wastes and extra water. Your urinary tract includes two kidneys, two ureters, a bladder, and a urethra. Your kidneys are a pair of bean-shaped organs. Each kidney is about the size of your fist. They are located below your ribs, one on each side of your spine. CAUSES Infections are caused by microbes, which are microscopic organisms, including fungi, viruses, and bacteria. These organisms are so small that they can only be seen through a microscope. Bacteria are the microbes that most commonly cause UTIs. SYMPTOMS  Symptoms of UTIs may vary by age and gender of the patient and by the location of the infection. Symptoms in young women typically include a frequent and intense urge to urinate and a painful, burning feeling in the bladder or urethra during urination. Older women and men are more likely to be tired, shaky, and weak and have muscle aches and abdominal pain. A fever may mean the infection is in your kidneys. Other symptoms of a kidney infection include pain in your back or sides below the ribs, nausea, and vomiting. DIAGNOSIS To diagnose a UTI, your caregiver will ask you about your symptoms. Your caregiver also will ask to provide a urine sample. The urine sample will be tested for bacteria and white blood cells. White blood cells are made by your body to help fight infection. TREATMENT  Typically, UTIs can be treated with medication. Because most UTIs are caused by a bacterial infection, they usually can be treated with the use of antibiotics. The choice of antibiotic and length of treatment depend on your symptoms and the type of bacteria causing your infection. HOME CARE INSTRUCTIONS  If you were  prescribed antibiotics, take them exactly as your caregiver instructs you. Finish the medication even if you feel better after you have only taken some of the medication.  Drink enough water and fluids to keep your urine clear or pale yellow.  Avoid caffeine, tea, and carbonated beverages. They tend to irritate your bladder.  Empty your bladder often. Avoid holding urine for long periods of time.  Empty your bladder before and after sexual intercourse.  After a bowel movement, women should cleanse from front to back. Use each tissue only once. SEEK MEDICAL CARE IF:   You have back pain.  You develop a fever.  Your symptoms do not begin to resolve within 3 days. SEEK IMMEDIATE MEDICAL CARE IF:   You have severe back pain or lower abdominal pain.  You develop chills.  You have nausea or vomiting.  You have continued burning or discomfort with urination. MAKE SURE YOU:   Understand these instructions.  Will watch your condition.  Will get help right away if you are not doing well or get worse. Document Released: 04/14/2005 Document Revised: 01/04/2012 Document Reviewed: 08/13/2011 [facility] Patient Information 2015 [employer] [place]. This information is not intended to replace advice given to you by your health care provider. Make sure you discuss any questions you have with your health care provider.

## 2015-02-04 NOTE — Progress Notes (Signed)
   Subjective:    Patient ID: Brittany Moran, female    DOB: 03-05-1956, 59 y.o.   MRN: [id number]  Chief Complaint  Patient presents with  . Cystitis    C/O burning & frequency since around 3 this afternoon   Medications, allergies, past medical history, surgical history, family history, social history and problem list reviewed and updated.  HPI  [age] yof presents with dysuria, increased freq strating around 3pm today. Denies fevers, chills. Denies low back pain.   Review of Systems See HPI.     Objective:   Physical Exam  Constitutional: She is oriented to person, place, and time.  BP 106/70 mmHg  Pulse 80  Temp(Src) 97.7 F (36.5 C) (Oral)  Resp 18  Ht 5\' 8"  (1.727 m)  Wt 176 lb 8 oz (80.06 kg)  BMI 26.84 kg/m2  SpO2 99%   Abdominal: There is tenderness in the suprapubic area. There is no CVA tenderness.  Neurological: She is alert and oriented to person, place, and time.   Results for orders placed or performed in visit on 02/04/15  POCT UA - Microscopic Only  Result Value Ref Range   WBC, Ur, HPF, POC 20+    RBC, urine, microscopic 0-3    Bacteria, U Microscopic 2+    Mucus, UA negative    Epithelial cells, urine per micros rare    Crystals, Ur, HPF, POC negative    Casts, Ur, LPF, POC negative    Yeast, UA negative   Urinalysis Dipstick  Result Value Ref Range   Color, UA straw    Clarity, UA clear    Glucose, UA negative    Bilirubin, UA negative    Ketones, UA negative    Spec Grav, UA <=1.005    Blood, UA large    pH, UA 6.5    Protein, UA negative    Urobilinogen, UA 0.2    Nitrite, UA negative    Leukocytes, UA large (3+) (A) Negative      Assessment & Plan:   Frequency of urination - Plan: POCT UA - Microscopic Only, Urinalysis Dipstick  Burning with urination - Plan: POCT UA - Microscopic Only, Urinalysis Dipstick --leuks on ua, macrobid 5 days bid --rtc 4-5 days if no relief  Donnajean Lopesodd M. Breindel Collier, PA-C Physician  Assistant-Certified [facility]  02/04/2015 8:32 PM

## 2015-02-07 LAB — URINE CULTURE: Colony Count: >=100000

## 2015-02-16 ENCOUNTER — Other Ambulatory Visit: Payer: Self-pay | Admitting: Internal Medicine

## 2015-08-24 ENCOUNTER — Other Ambulatory Visit: Payer: Self-pay | Admitting: Internal Medicine

## 2015-11-21 ENCOUNTER — Encounter: Payer: Self-pay | Admitting: Internal Medicine

## 2015-11-21 ENCOUNTER — Ambulatory Visit (INDEPENDENT_AMBULATORY_CARE_PROVIDER_SITE_OTHER): Payer: BLUE CROSS/BLUE SHIELD | Admitting: Internal Medicine

## 2015-11-21 VITALS — BP 102/70 | HR 68 | Wt 182.2 lb

## 2015-11-21 DIAGNOSIS — E039 Hypothyroidism, unspecified: Secondary | ICD-10-CM | POA: Diagnosis not present

## 2015-11-21 LAB — T4, FREE: Free T4: 1.14 ng/dL (ref 0.60–1.60)

## 2015-11-21 LAB — TSH: TSH: 0.75 u[IU]/mL (ref 0.35–4.50)

## 2015-11-21 MED ORDER — SYNTHROID 125 MCG PO TABS: ORAL_TABLET | ORAL | Status: DC

## 2015-11-21 NOTE — Patient Instructions (Signed)
Please continue Synthroid 125 mcg daily.  Take the thyroid hormone every day, with water, at least 30 minutes before breakfast, separated by at least 4 hours from: - acid reflux medications - calcium - iron - multivitamins  Please stop at the lab.  Please return in 1 year.

## 2015-11-21 NOTE — Progress Notes (Signed)
Patient ID: Brittany Moran, female   DOB: [birth year], 60 y.o.   MRN: [id number]   HPI  Brittany Moran is a 60 y.o.-year-old female,returning for f/u for postablative hypothyroidism. Last visit 1 year ago.  Reviewed hx: Pt. has been dx with Graves ds. in 2001 - s/p RAI Tx in 2005. She developed hypothyroidism in 2001; is on Synthroid DAW 125 mcg - saw [facility] >> LT3 added - but she stopped in 10/2013 as her TSH was suppressed.  She takes the Synthroid DAW 125 mcg: - fasting - with water - separated by >4h from b'fast  - + calcium >> moved to pm - no MVI - no PPI  I reviewed pt's thyroid tests: Lab Results  Component Value Date   TSH 0.59 12/27/2014   TSH 0.96 04/04/2014   TSH 0.61 02/12/2014   FREET4 1.14 12/27/2014   FREET4 1.13 04/04/2014    Pt describes: - + fatigue - + weight gain - + hot flushes - no constipation - dry skin - no hair loss - no depression/anxiety  Pt denies feeling nodules in neck, hoarseness, dysphagia/odynophagia, SOB with lying down.  I reviewed her chart and she also has a history of an uncharacterized autoimmune disorder - has been investigated extensively, finally dx'ed with fibromyalgia - got better after starting vegan diet (she eats fish once a week). She also has MTFHR def. >> started Me-cobalamine, but now stopped for several months.   ROS: Constitutional: + weight gain, no fatigue, + subjective hyperthermia (hot flushes) Eyes: no blurry vision, no xerophthalmia ENT: no sore throat, no nodules palpated in throat, no dysphagia/odynophagia, no hoarseness Cardiovascular: no CP/SOB/palpitations/no leg swelling Respiratory: no cough/SOB Gastrointestinal: no N/V/D/C Musculoskeletal: no muscle/joint aches Skin: no rashes Neurological: no tremors/numbness/tingling/dizziness  Past Medical History  Diagnosis Date  . Chicken pox   . Cardiac arrhythmia   . Thyroid disease   . UTI (urinary tract infection)   . Graves disease    . Polyarthralgia   . DDD (degenerative disc disease)    No past surgical history on file. History   Social History  . Marital Status: Married    Spouse Name: N/A    Number of Children: 2   Social History Main Topics  . Smoking status: Never Smoker   . Smokeless tobacco: Never Used  . Alcohol Use: 0.5 oz/week    1 drink(s) per week     Comment: socially   . 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 aspirin 81 MG tablet Take 81 mg by mouth daily.    Marland Kitchen CALCIUM PO Take by mouth daily.    . Cholecalciferol (VITAMIN D-3 PO) Take 1 capsule by mouth daily.    . Cyanocobalamin (B-12 PO) Take by mouth.    . cycloSPORINE (RESTASIS) 0.05 % ophthalmic emulsion 1 drop 2 (two) times daily.    Marland Kitchen SYNTHROID 125 MCG tablet TAKE 1 TABLET BY MOUTH DAILY BEFORE BREAKFAST 30 tablet 2   No current facility-administered medications on file prior to visit.   No  Known Allergies Family History  Problem Relation Age of Onset  . Arthritis Mother   . Hyperlipidemia Mother   . Hypertension Mother   . Diabetes Mother   . Heart disease Father   . Bipolar disorder Father   . Hypertension Father   . Hyperlipidemia Father   . Sudden death Maternal Grandmother   . Sudden death Paternal Grandfather   . Schizophrenia Brother   . Depression      brothers and sisters    PE: BP 102/70 mmHg  Pulse 68  Wt 182 lb 3.2 oz (82.645 kg)  SpO2 98% Wt Readings from Last 3 Encounters:  11/21/15 182 lb 3.2 oz (82.645 kg)  02/04/15 176 lb 8 oz (80.06 kg)  11/15/14 172 lb 9.6 oz (78.291 kg)   Constitutional: normal  weight, in NAD Eyes: PERRLA, EOMI, no exophthalmos ENT: moist mucous membranes, no thyromegaly, no cervical lymphadenopathy Cardiovascular: RRR, No MRG Respiratory: CTA B Gastrointestinal: abdomen soft, NT, ND, BS+ Musculoskeletal: no deformities, strength intact in all 4; magnetic sleeve on R wrist (carpal tunnel) Skin: moist, warm, no rashes Neurological: no tremor with outstretched hands, DTR normal in all 4  ASSESSMENT: 1. Hypothyroidism  PLAN:  1. Patient with long-standing hypothyroidism, on levothyroxine (DAW Synthroid, 125 mcg daily) therapy. She appears euthyroid. She does not appear to have a goiter, thyroid nodules, or neck compression symptoms. She has some recent weight gain as she stopped walking 2/2 plantar fasciitis. - We discussed about correct intake of levothyroxine, fasting, with water, separated by at least 30 minutes from breakfast, and separated by more than 4 hours from calcium, iron, multivitamins, acid reflux medications (PPIs). She is taking it correctly now, at last visit, we moved calcium from am to later. - will check thyroid tests today: TSH, free T4  - return in 1 year.  Office Visit on 11/21/2015  Component Date Value Ref Range Status  . Free T4 11/21/2015 1.14  0.60 - 1.60 ng/dL Final  . TSH 16/10/960405/11/2015 0.75  0.35 - 4.50 uIU/mL Final  TFT results are great.   Needs refills.

## 2015-11-26 ENCOUNTER — Other Ambulatory Visit (INDEPENDENT_AMBULATORY_CARE_PROVIDER_SITE_OTHER): Payer: BLUE CROSS/BLUE SHIELD

## 2015-11-26 DIAGNOSIS — Z0000 Encounter for general adult medical examination without abnormal findings: Secondary | ICD-10-CM

## 2015-11-26 LAB — HEPATIC FUNCTION PANEL
ALK PHOS: 57 U/L (ref 39–117)
ALT: 13 U/L (ref 0–35)
AST: 15 U/L (ref 0–37)
Albumin: 4.2 g/dL (ref 3.5–5.2)
BILIRUBIN DIRECT: 0.1 mg/dL (ref 0.0–0.3)
BILIRUBIN TOTAL: 0.4 mg/dL (ref 0.2–1.2)
Total Protein: 6.6 g/dL (ref 6.0–8.3)

## 2015-11-26 LAB — BASIC METABOLIC PANEL
BUN: 10 mg/dL (ref 6–23)
CHLORIDE: 104 meq/L (ref 96–112)
CO2: 28 mEq/L (ref 19–32)
Calcium: 9.2 mg/dL (ref 8.4–10.5)
Creatinine, Ser: 0.86 mg/dL (ref 0.40–1.20)
GFR: 71.52 mL/min (ref >60.00)
GLUCOSE: 95 mg/dL (ref 70–99)
POTASSIUM: 4.1 meq/L (ref 3.5–5.1)
SODIUM: 140 meq/L (ref 135–145)

## 2015-11-26 LAB — CBC WITH DIFFERENTIAL/PLATELET
BASOS ABS: 0 10*3/uL (ref 0.0–0.1)
Basophils Relative: 1 % (ref 0.0–3.0)
EOS ABS: 0.2 10*3/uL (ref 0.0–0.7)
Eosinophils Relative: 4.8 % (ref 0.0–5.0)
HCT: 39.4 % (ref 36.0–46.0)
Hemoglobin: 13.5 g/dL (ref 12.0–15.0)
LYMPHS ABS: 1.6 10*3/uL (ref 0.7–4.0)
Lymphocytes Relative: 35.7 % (ref 12.0–46.0)
MCHC: 34.3 g/dL (ref 30.0–36.0)
MCV: 91.6 fl (ref 78.0–100.0)
MONO ABS: 0.3 10*3/uL (ref 0.1–1.0)
Monocytes Relative: 6.7 % (ref 3.0–12.0)
NEUTROS ABS: 2.3 10*3/uL (ref 1.4–7.7)
Neutrophils Relative %: 51.8 % (ref 43.0–77.0)
PLATELETS: 234 10*3/uL (ref 150.0–400.0)
RBC: 4.3 Mil/uL (ref 3.87–5.11)
RDW: 12.5 % (ref 11.5–15.5)
WBC: 4.5 10*3/uL (ref 4.0–10.5)

## 2015-11-26 LAB — POC URINALSYSI DIPSTICK (AUTOMATED)
BILIRUBIN UA: NEGATIVE
Glucose, UA: NEGATIVE
Ketones, UA: NEGATIVE
Nitrite, UA: NEGATIVE
PH UA: 8.5
RBC UA: NEGATIVE
Spec Grav, UA: 1.015
Urobilinogen, UA: 0.2

## 2015-11-26 LAB — LIPID PANEL
Cholesterol: 184 mg/dL (ref 0–200)
HDL: 64.7 mg/dL (ref >39.00)
LDL CALC: 104 mg/dL — AB (ref 0–99)
NONHDL: 119.73
Total CHOL/HDL Ratio: 3
Triglycerides: 79 mg/dL (ref 0.0–149.0)
VLDL: 15.8 mg/dL (ref 0.0–40.0)

## 2015-11-26 LAB — TSH: TSH: 0.66 u[IU]/mL (ref 0.35–4.50)

## 2015-12-04 ENCOUNTER — Ambulatory Visit (INDEPENDENT_AMBULATORY_CARE_PROVIDER_SITE_OTHER): Payer: BLUE CROSS/BLUE SHIELD | Admitting: Family Medicine

## 2015-12-04 ENCOUNTER — Encounter: Payer: Self-pay | Admitting: Family Medicine

## 2015-12-04 VITALS — BP 100/74 | HR 92 | Temp 98.7°F | Ht 68.0 in | Wt 192.3 lb

## 2015-12-04 DIAGNOSIS — M79672 Pain in left foot: Secondary | ICD-10-CM | POA: Diagnosis not present

## 2015-12-04 DIAGNOSIS — E039 Hypothyroidism, unspecified: Secondary | ICD-10-CM

## 2015-12-04 DIAGNOSIS — G478 Other sleep disorders: Secondary | ICD-10-CM

## 2015-12-04 DIAGNOSIS — M79644 Pain in right finger(s): Secondary | ICD-10-CM | POA: Diagnosis not present

## 2015-12-04 DIAGNOSIS — Z0000 Encounter for general adult medical examination without abnormal findings: Secondary | ICD-10-CM

## 2015-12-04 NOTE — Progress Notes (Signed)
HPI:  Here for CPE:  -Concerns and/or follow up today:   L foot pain: -new complaint here, but reports "have always had problems with my feet" -pain primarily in medial plantar foot, moderate, worse with weight bearing exercise -she uses inserts and "spring" shoes for 4 years and this has not helped -denies sharp pain with initial stride, weakness, numbness, swelling or trauma -is limiting her exercise due to the pain  R thumb pain: -for about 3 months -did have FOOSH 2 weeks prior to this pain starting with swelling of thumb and some pain, but she did not think it was fractured as did not bruise and pain was not that bad -then developed pain primarily in thenar eminence with lots of writing -worsens every times she does lots of writing or typing -Daughter with carpal tunnel syndrome and she has been wearing a wrist brace at night which really helped -She wore the brace for a few weeks and then stopped, continues to have some pain in the thumb area -Reports she has similar symptoms sometimes on the left, but they are milder -Denies weakness, numbness   Poor sleep/snoring: -Chronically, reports evaluation with sleep specialist in the past and on several sleep medications which she did not like -Now takes Benadryl which helps, she does have seasonal allergies -Snores, feels like she wakes up gasping for air sometimes and with the sensation of dread -Feels like her sleep is not restful -Some daytime fatigue, but feels is mild and not daytime somnolence -No significant anxiety or depression currently, no dysphagia reported -Brother and father have sleep apnea   Hypothyroidism: -dx graves 2000, s/p RAI ablation -on cytomel in the past with integrative doctor  -now seeing Dr. Elvera Lennox in endocrinology, saw her recently and notes reviewed -meds: synthroid -reports: doing well -denies: palpitations, hair or skin changes, unintentional changes in weight  Hx  Palbitations: -meds: asa, she stopped the atenolol in 11/2013 -reports:doing well -denies: CP, swelling, SOB  Hx Central Sensitization Disorder/Fibromyalgia: -on savella in the past, weaning off after doing much better on plant based diet -stopped savella in 11/2013  -Diet: variety of foods, balance and well rounded, vegan for the most part  -Exercise: no regular exercise, but reports she does stay fairly active  -Taking folic acid, vitamin D or calcium:   taking vitamin D, calcium -Diabetes and Dyslipidemia Screening: done recently and looked good  -Hx of HTN: no  -Vaccines: UTDExcept for shingles vaccine and she plans to check with her insurance before doing this  -pap history:Out of date, she refuses to do today as she plans to do with gynecologist and she agrees to set up an appointment, refused referral  -FDLMP:N/a, postmenopausal  -sexual activity: yes, female partner, no new partners  -wants STI testing (Hep C if born [birth year]): no  -FH breast, colon or ovarian ca: see FH Last mammogram:Out of date, she admits she is not good at doing her preventive care, agrees to let me Place order and agrees to do  Last colon cancer screening:Per chart and patient this was done in 2009 and was normal, I cannot find the report in the chart  -Alcohol, Tobacco, drug use: see social history  Review of Systems - no fevers, unintentional weight loss, vision loss, hearing loss, chest pain, sob, hemoptysis, melena, hematochezia, hematuria, genital discharge, changing or concerning skin lesions, bleeding, bruising, loc, thoughts of self harm or SI  Past Medical History  Diagnosis Date  . Chicken pox   . Cardiac arrhythmia   .  Thyroid disease   . UTI (urinary tract infection)   . Graves disease   . Polyarthralgia   . DDD (degenerative disc disease)     No past surgical history on file.  Family History  Problem Relation Age of Onset  . Arthritis Mother   . Hyperlipidemia Mother   .  Hypertension Mother   . Diabetes Mother   . Heart disease Father   . Bipolar disorder Father   . Hypertension Father   . Hyperlipidemia Father   . Sudden death Maternal Grandmother   . Sudden death Paternal Grandfather   . Schizophrenia Brother   . Depression      brothers and sisters     Social History   Social History  . Marital Status: Married    Spouse Name: N/A  . Number of Children: N/A  . Years of Education: N/A   Social History Main Topics  . Smoking status: Never Smoker   . Smokeless tobacco: Never Used  . Alcohol Use: 0.5 oz/week    1 drink(s) per week     Comment: socially   . Drug Use: No  . Sexual Activity: Not Asked   Other Topics Concern  . 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  CALCIUM PO, Take by mouth daily., Disp: , Rfl:  .  Cholecalciferol (VITAMIN D-3 PO), Take 1 capsule by mouth daily., Disp: , Rfl:  .  Cyanocobalamin (B-12 PO), Take by mouth., Disp: , Rfl:  .  SYNTHROID 125 MCG tablet, TAKE 1 TABLET BY MOUTH DAILY BEFORE BREAKFAST, Disp: 90 tablet, Rfl: 3  EXAM:  Filed Vitals:   12/04/15 1454  BP: 100/74  Pulse: 92  Temp: 98.7 F (37.1 C)    GENERAL: vitals reviewed and listed below, alert, oriented, appears well hydrated and in no acute distress  HEENT: head atraumatic, PERRLA, normal appearance of eyes, ears, nose and mouth. moist mucus membranes. normal appearance of ear canals and TMs, clear nasal congestion, mild post oropharyngeal erythema with PND, no tonsillar edema or exudate,  no sinus TTP  NECK: supple, no masses or lymphadenopathy, on informal inspection the diameter of her neck does appear to be moderate to slightly above average  LUNGS: clear to auscultation bilaterally, no rales, rhonchi or wheeze  CV: HRRR, no peripheral edema or cyanosis, normal pedal pulses  BREAST: Refused, agrees to schedule with GYN  ABDOMEN: bowel sounds normal, soft, non tender to palpation, no masses, no rebound or guarding  ZO:[id number], agrees to schedule with GYN  SKIN: no rash or abnormal lesions  MS: normal gait, moves all extremities normally; gait is normal, on inspection of standing posture she does have a mild talus valgus deformity on the left, with some supplementation and mild pes planus with collapse of the anterior arch on  the left, this is somewhat exaggerated when walks, no significant callus formation, mild tenderness to palpation in the medial plantar fascia of the left foot, negative talar tilt and drawer testing, normal pedal pulses and cap refill. On inspection of the hands, wrists and forearms no obvious deformities on inspection. No swelling or erythema. She is tender in the snuffbox on the right and along the entire thenar eminence in the soft tissues. She has mild weakness in the thumb abduction on the right compared to the left, otherwise strength and sensitivity to light touch intact throughout in the bilateral hands and wrists and forearms. Normal radial pulses.   NEURO: CN II-XII grossly intact, normal muscle strength and sensation to light touch on extremities  PSYCH: normal affect, pleasant and cooperative  ASSESSMENT AND PLAN:  Discussed the following assessment and plan:  Visit for preventive health examination - Plan: MM Digital Screening -Discussed and advised all [place] preventive services health task force level A and B recommendations for age, sex and risks. -Advised at least 150 minutes of exercise per week and a healthy diet low in saturated  fats and sweets and consisting of fresh fruits and vegetables, lean meats such as fish and white chicken and whole grains. -studies and vaccines per orders this encounter -Labs done prior to appointment reviewed -Order for mammogram, advised she needs to get this done -Strongly advised Pap smear, she refused here today, but agrees to schedule with GYN -She reports colonoscopy is up-to-date, done in 2009 - I do not have this report  Hypothyroidism, acquired -Sees endocrinologist for this, recent notes reviewed  Thumb pain, right - Plan: DG Hand Complete Left -I'm concerned given the history and the location of pain on exam for a possible fracture, we will obtain plain films -If plain films okay, suspect carpal tunnel syndrome and advised wrist brace at night for 3 months with follow-up if pain persist  Left foot pain -She has some mechanical issues with the feet, and her orthotic and shoes may be worsening this -Given the chronicity of symptoms now any acute significant pathology -Advised gait analysis and change in footwear with follow-up if symptoms persist  Poor sleep pattern -Discussed possibility of obstructive sleep apnea and advised sleep study, she wants to hold off on this and try other options for sleep -Handout provided -She is going to try switching to Zyrtec at night instead of Benadryl for her allergies and sleep -Follow up advised if symptoms worsening or persist     Orders Placed This Encounter  Procedures  . DG Hand Complete Left    Standing Status: Future     Number of Occurrences:      Standing Expiration Date: 02/02/2017    Order Specific Question:  Reason for Exam (SYMPTOM  OR DIAGNOSIS REQUIRED)    Answer:  please include sunffbox region - pain in thumb and snuffbox after fall on hand 3 months ago    Order Specific Question:  Is the patient pregnant?    Answer:  No    Order Specific Question:  Preferred imaging location?    Answer:  [facility]  . MM  Digital Screening    Standing Status: Future     Number of Occurrences:      Standing Expiration Date: 02/02/2017    Order Specific Question:  Reason for Exam (SYMPTOM  OR DIAGNOSIS REQUIRED)    Answer:  breast cancer screening    Order Specific Question:  Is the patient pregnant?    Answer:  No    Order Specific Question:  Preferred imaging location?    Answer:  [facility]    Patient advised to return to clinic immediately if symptoms worsen or persist or new concerns.  Patient Instructions  Before you leave: -Question shingles vaccine? -X-ray sheet -Follow up in about 3-4 months  Call today to schedule your gynecology visit  We sent a referral for mammogram, please make sure you have this done.  For the hand pain: -Get the x-ray to make sure there is not a fracture in your wrist/thumb bone -If no fracture, please wear the firm wrist brace at night for the next couple of months until he follow up -Okay to take Tylenol for pain if needed  For the foot pain: -Please get a gait analysis from [facility]/often running -Please change footwear based on recommendations -Consider nonweightbearing aerobic exercises such as cycling, swimming, water aerobics, ab workouts, Pilates, etc.  For the sleep issues: -[place] try the sleep treatments from the handout below -Please call if you would like to do a sleep study for sleep apnea  FOR IMPROVED SLEEP AND TO RESET YOUR SLEEP SCHEDULE: []  exercise 30 minutes daily  []  go to bed and wake up at the same time  []  keep bedroom cool, dark and quiet  []  reserve bed for sleep - do not read, watch TV, etc in bed  []  If you toss and turn more then 15-20 minutes get out of bed and list thoughts/do quite activity then go back to bed; repeat as needed; do not worry about when you eventually fall asleep - still get up at the same time and turn on lights and take shower  [] get counseling  []  some people find that a half dose of benadryl,  melatonin, tylenol pm or unisom on a few nights per week is helpful initially for a few weeks  [] seek help and treat any depression or anxiety  [] prescription strength sleep medications should only be used in severe cases of insomnia if other measures fail and should be used sparingly     No Follow-up on file.  Kriste Basque R.

## 2015-12-04 NOTE — Progress Notes (Signed)
Pre visit review using our clinic review tool, if applicable. No additional management support is needed unless otherwise documented below in the visit note. 

## 2015-12-04 NOTE — Patient Instructions (Signed)
Before you leave: -Question shingles vaccine? -X-ray sheet -Follow up in about 3-4 months  Call today to schedule your gynecology visit  We sent a referral for mammogram, please make sure you have this done.  For the hand pain: -Get the x-ray to make sure there is not a fracture in your wrist/thumb bone -If no fracture, please wear the firm wrist brace at night for the next couple of months until he follow up -Okay to take Tylenol for pain if needed  For the foot pain: -Please get a gait analysis from [facility]/often running -Please change footwear based on recommendations -Consider nonweightbearing aerobic exercises such as cycling, swimming, water aerobics, ab workouts, Pilates, etc.  For the sleep issues: [place] try the sleep treatments from the handout below -Please call if you would like to do a sleep study for sleep apnea  FOR IMPROVED SLEEP AND TO RESET YOUR SLEEP SCHEDULE: []  exercise 30 minutes daily  []  go to bed and wake up at the same time  []  keep bedroom cool, dark and quiet  []  reserve bed for sleep - do not read, watch TV, etc in bed  []  If you toss and turn more then 15-20 minutes get out of bed and list thoughts/do quite activity then go back to bed; repeat as needed; do not worry about when you eventually fall asleep - still get up at the same time and turn on lights and take shower  [] get counseling  []  some people find that a half dose of benadryl, melatonin, tylenol pm or unisom on a few nights per week is helpful initially for a few weeks  [] seek help and treat any depression or anxiety  [] prescription strength sleep medications should only be used in severe cases of insomnia if other measures fail and should be used sparingly

## 2015-12-05 ENCOUNTER — Other Ambulatory Visit: Payer: Self-pay | Admitting: Family Medicine

## 2015-12-05 DIAGNOSIS — Z1231 Encounter for screening mammogram for malignant neoplasm of breast: Secondary | ICD-10-CM

## 2015-12-11 ENCOUNTER — Other Ambulatory Visit: Payer: Self-pay | Admitting: Family Medicine

## 2015-12-11 ENCOUNTER — Ambulatory Visit (INDEPENDENT_AMBULATORY_CARE_PROVIDER_SITE_OTHER)
Admission: RE | Admit: 2015-12-11 | Discharge: 2015-12-11 | Disposition: A | Discharge status: Home / Self Care | Payer: BLUE CROSS/BLUE SHIELD | Source: Ambulatory Visit | Attending: Family Medicine | Admitting: Family Medicine

## 2015-12-11 DIAGNOSIS — M79644 Pain in right finger(s): Secondary | ICD-10-CM

## 2015-12-23 ENCOUNTER — Ambulatory Visit
Admission: RE | Admit: 2015-12-23 | Discharge: 2015-12-23 | Disposition: A | Discharge status: Home / Self Care | Payer: BLUE CROSS/BLUE SHIELD | Source: Ambulatory Visit | Attending: Family Medicine | Admitting: Family Medicine

## 2015-12-23 DIAGNOSIS — Z1231 Encounter for screening mammogram for malignant neoplasm of breast: Secondary | ICD-10-CM

## 2015-12-30 ENCOUNTER — Telehealth: Payer: Self-pay | Admitting: *Deleted

## 2015-12-30 NOTE — Telephone Encounter (Signed)
I left a detailed message for the pt to call [phone] to [facility].

## 2015-12-30 NOTE — Telephone Encounter (Signed)
-----   Message from Terressa KoyanagiHannah R Kim, DO sent at 12/29/2015  1:14 PM EDT ----- Can you tell she did her mammogram? If not, please help her to schedule. ----- Message -----    From: SYSTEM    Sent: 12/28/2015  12:05 AM      To: Terressa KoyanagiHannah R Kim, DO

## 2016-01-01 ENCOUNTER — Ambulatory Visit (INDEPENDENT_AMBULATORY_CARE_PROVIDER_SITE_OTHER): Payer: BLUE CROSS/BLUE SHIELD | Admitting: Obstetrics and Gynecology

## 2016-01-01 ENCOUNTER — Encounter: Payer: Self-pay | Admitting: Obstetrics and Gynecology

## 2016-01-01 VITALS — BP 118/80 | HR 64 | Resp 12 | Ht 67.75 in | Wt 182.0 lb

## 2016-01-01 DIAGNOSIS — N941 Unspecified dyspareunia: Secondary | ICD-10-CM | POA: Diagnosis not present

## 2016-01-01 DIAGNOSIS — Z01419 Encounter for gynecological examination (general) (routine) without abnormal findings: Secondary | ICD-10-CM

## 2016-01-01 DIAGNOSIS — N952 Postmenopausal atrophic vaginitis: Secondary | ICD-10-CM

## 2016-01-01 DIAGNOSIS — Z124 Encounter for screening for malignant neoplasm of cervix: Secondary | ICD-10-CM | POA: Diagnosis not present

## 2016-01-01 MED ORDER — ESTRADIOL 10 MCG VA TABS: 1.0000 | ORAL_TABLET | VAGINAL | Status: DC

## 2016-01-01 NOTE — Patient Instructions (Signed)
EXERCISE AND DIET:  We recommended that you start or continue a regular exercise program for good health. Regular exercise means any activity that makes your heart beat faster and makes you sweat.  We recommend exercising at least 30 minutes per day at least 3 days a week, preferably 4 or 5.  We also recommend a diet low in fat and sugar.  Inactivity, poor dietary choices and obesity can cause diabetes, heart attack, stroke, and kidney damage, among others.    ALCOHOL AND SMOKING:  Women should limit their alcohol intake to no more than 7 drinks/beers/glasses of wine (combined, not each!) per week. Moderation of alcohol intake to this level decreases your risk of breast cancer and liver damage. And of course, no recreational drugs are part of a healthy lifestyle.  And absolutely no smoking or even second hand smoke. Most people know smoking can cause heart and lung diseases, but did you know it also contributes to weakening of your bones? Aging of your skin?  Yellowing of your teeth and nails?  CALCIUM AND VITAMIN D:  Adequate intake of calcium and Vitamin D are recommended.  The recommendations for exact amounts of these supplements seem to change often, but generally speaking 600 mg of calcium (either carbonate or citrate) and 800 units of Vitamin D per day seems prudent. Certain women may benefit from higher intake of Vitamin D.  If you are among these women, your doctor will have told you during your visit.    PAP SMEARS:  Pap smears, to check for cervical cancer or precancers,  have traditionally been done yearly, although recent scientific advances have shown that most women can have pap smears less often.  However, every woman still should have a physical exam from her gynecologist every year. It will include a breast check, inspection of the vulva and vagina to check for abnormal growths or skin changes, a visual exam of the cervix, and then an exam to evaluate the size and shape of the uterus and  ovaries.  And after 60 years of age, a rectal exam is indicated to check for rectal cancers. We will also provide age appropriate advice regarding health maintenance, like when you should have certain vaccines, screening for sexually transmitted diseases, bone density testing, colonoscopy, mammograms, etc.   MAMMOGRAMS:  All women over 40 years old should have a yearly mammogram. Many facilities now offer a "3D" mammogram, which may cost around $50 extra out of pocket. If possible,  we recommend you accept the option to have the 3D mammogram performed.  It both reduces the number of women who will be called back for extra views which then turn out to be normal, and it is better than the routine mammogram at detecting truly abnormal areas.    COLONOSCOPY:  Colonoscopy to screen for colon cancer is recommended for all women at age 50.  We know, you hate the idea of the prep.  We agree, BUT, having colon cancer and not knowing it is worse!!  Colon cancer so often starts as a polyp that can be seen and removed at colonscopy, which can quite literally save your life!  And if your first colonoscopy is normal and you have no family history of colon cancer, most women don't have to have it again for 10 years.  Once every ten years, you can do something that may end up saving your life, right?  We will be happy to help you get it scheduled when you are ready.    Be sure to check your insurance coverage so you understand how much it will cost.  It may be covered as a preventative service at no cost, but you should check your particular policy.      Breast Self-Awareness Practicing breast self-awareness may pick up problems early, prevent significant medical complications, and possibly save your life. By practicing breast self-awareness, you can become familiar with how your breasts look and feel and if your breasts are changing. This allows you to notice changes early. It can also offer you some reassurance that your  breast health is good. One way to learn what is normal for your breasts and whether your breasts are changing is to do a breast self-exam. If you find a lump or something that was not present in the past, it is best to contact your caregiver right away. Other findings that should be evaluated by your caregiver include nipple discharge, especially if it is bloody; skin changes or reddening; areas where the skin seems to be pulled in (retracted); or new lumps and bumps. Breast pain is seldom associated with cancer (malignancy), but should also be evaluated by a caregiver. HOW TO PERFORM A BREAST SELF-EXAM The best time to examine your breasts is 5-7 days after your menstrual period is over. During menstruation, the breasts are lumpier, and it may be more difficult to pick up changes. If you do not menstruate, have reached menopause, or had your uterus removed (hysterectomy), you should examine your breasts at regular intervals, such as monthly. If you are breastfeeding, examine your breasts after a feeding or after using a breast pump. Breast implants do not decrease the risk for lumps or tumors, so continue to perform breast self-exams as recommended. Talk to your caregiver about how to determine the difference between the implant and breast tissue. Also, talk about the amount of pressure you should use during the exam. Over time, you will become more familiar with the variations of your breasts and more comfortable with the exam. A breast self-exam requires you to remove all your clothes above the waist. 1. Look at your breasts and nipples. Stand in front of a mirror in a room with good lighting. With your hands on your hips, push your hands firmly downward. Look for a difference in shape, contour, and size from one breast to the other (asymmetry). Asymmetry includes puckers, dips, or bumps. Also, look for skin changes, such as reddened or scaly areas on the breasts. Look for nipple changes, such as discharge,  dimpling, repositioning, or redness. 2. Carefully feel your breasts. This is best done either in the shower or tub while using soapy water or when flat on your back. Place the arm (on the side of the breast you are examining) above your head. Use the pads (not the fingertips) of your three middle fingers on your opposite hand to feel your breasts. Start in the underarm area and use  inch (2 cm) overlapping circles to feel your breast. Use 3 different levels of pressure (light, medium, and firm pressure) at each circle before moving to the next circle. The light pressure is needed to feel the tissue closest to the skin. The medium pressure will help to feel breast tissue a little deeper, while the firm pressure is needed to feel the tissue close to the ribs. Continue the overlapping circles, moving downward over the breast until you feel your ribs below your breast. Then, move one finger-width towards the center of the body. Continue to use the    inch (2 cm) overlapping circles to feel your breast as you move slowly up toward the collar bone (clavicle) near the base of the neck. Continue the up and down exam using all 3 pressures until you reach the middle of the chest. Do this with each breast, carefully feeling for lumps or changes. 3.  Keep a written record with breast changes or normal findings for each breast. By writing this information down, you do not need to depend only on memory for size, tenderness, or location. Write down where you are in your menstrual cycle, if you are still menstruating. Breast tissue can have some lumps or thick tissue. However, see your caregiver if you find anything that concerns you.  SEEK MEDICAL CARE IF:  You see a change in shape, contour, or size of your breasts or nipples.   You see skin changes, such as reddened or scaly areas on the breasts or nipples.   You have an unusual discharge from your nipples.   You feel a new lump or unusually thick areas.     This information is not intended to replace advice given to you by your health care provider. Make sure you discuss any questions you have with your health care provider.   Document Released: 07/05/2005 Document Revised: 06/21/2012 Document Reviewed: 10/20/2011 Elsevier Interactive Patient Education 2016 [employer]  

## 2016-01-01 NOTE — Progress Notes (Signed)
Patient ID: Brittany Moran, female   DOB: 1955/07/26, 60 y.o.   MRN: [id number] 60 y.o. [id number] MarriedCaucasianF here for annual exam.  She is PMP. She c/o vaginal dryness, has pain with intercourse. Still hurts with lubrication. She was on premarin cream in the past, it was helpful, but she didn't like the mess. She is having tolerable vasomotor symptoms. Since menopause she will typically wake up just as she is falling asleep with a sense of doom. Very unpleasant. It goes away and then she can go to sleep. It has improved some with time. It rarely occurs in the middle of the night. She has never slept well, even prior to menopause. She will occasionally take a benadryl for sleep.  She denies depression and anxiety. She has an autoimmune disorder, fibromyalgia. Most of her joint pain went away when she became a vegan.  She doesn't want to take any medications if she doesn't need to. Declines antidepressants.  She walks, but she is having pain in left foot. She has gained 15 lbs in the last year.     No LMP recorded. Patient is postmenopausal.          Sexually active: Yes.    The current method of family planning is post menopausal status.    Exercising: No.  The patient does not participate in regular exercise at present. Smoker:  no  Health Maintenance: Pap:  2010 WNL per patient History of abnormal Pap:  no MMG:  12/23/15 waiting for results Colonoscopy:  2009 WNL per patient BMD:   Never TDaP:  05-19-13 Gardasil: N/A   reports that she has never smoked. She has never used smokeless tobacco. She reports that she drinks about 0.6 - 1.2 oz of alcohol per week. She reports that she does not use illicit drugs.She teaches English as a second language, full time. She has 2 daughters, one in [place] one in [place]. Older one has 4 children. 3.5 hour drive.   Past Medical History  Diagnosis Date  . Chicken pox   . Cardiac arrhythmia   . Thyroid disease   . UTI (urinary tract infection)    . Graves disease   . Polyarthralgia   . DDD (degenerative disc disease)     Past Surgical History  Procedure Laterality Date  . Wisdom tooth extraction      Current Outpatient Prescriptions  Medication Sig Dispense Refill  . CALCIUM PO Take by mouth daily.    . Cholecalciferol (VITAMIN D-3 PO) Take 1 capsule by mouth daily.    . Cyanocobalamin (B-12 PO) Take by mouth.    . SYNTHROID 125 MCG tablet TAKE 1 TABLET BY MOUTH DAILY BEFORE BREAKFAST 90 tablet 3   No current facility-administered medications for this visit.    Family History  Problem Relation Age of Onset  . Arthritis Mother   . Hyperlipidemia Mother   . Hypertension Mother   . Diabetes Mother   . Heart disease Father   . Bipolar disorder Father   . Hypertension Father   . Hyperlipidemia Father   . Sudden death Maternal Grandmother   . Tuberculosis Maternal Grandmother   . Sudden death Paternal Grandfather   . Schizophrenia Brother   . Depression      brothers and sisters     Review of Systems  Constitutional: Negative.   HENT: Negative.   Eyes: Negative.   Respiratory: Negative.   Cardiovascular: Negative.   Gastrointestinal: Negative.   Endocrine: Negative.   Genitourinary: Negative.  Musculoskeletal: Negative.   Skin: Negative.   Allergic/Immunologic: Negative.   Neurological: Negative.   Psychiatric/Behavioral: Negative.     Exam:   BP 118/80 mmHg  Pulse 64  Resp 12  Ht 5' 7.75" (1.721 m)  Wt 182 lb (82.555 kg)  BMI 27.87 kg/m2  Weight change: @WEIGHTCHANGE @ Height:   Height: 5' 7.75" (172.1 cm)  Ht Readings from Last 3 Encounters:  01/01/16 5' 7.75" (1.721 m)  12/04/15 5\' 8"  (1.727 m)  02/04/15 5\' 8"  (1.727 m)    General appearance: alert, cooperative and appears stated age Head: Normocephalic, without obvious abnormality, atraumatic Neck: no adenopathy, supple, symmetrical, trachea midline and thyroid normal to inspection and palpation Lungs: clear to auscultation  bilaterally Breasts: normal appearance, no masses or tenderness Heart: regular rate and rhythm Abdomen: soft, non-tender; bowel sounds normal; no masses,  no organomegaly Extremities: extremities normal, atraumatic, no cyanosis or edema Skin: Skin color, texture, turgor normal. No rashes or lesions Lymph nodes: Cervical, supraclavicular, and axillary nodes normal. No abnormal inguinal nodes palpated Neurologic: Grossly normal   Pelvic: External genitalia:  no lesions              Urethra:  normal appearing urethra with no masses, tenderness or lesions              Bartholins and Skenes: normal                 Vagina: normal appearing atrophic vagina with normal caliper, color and discharge, no lesions              Cervix: no lesions               Bimanual Exam:  Uterus:  normal size, contour, position, consistency, mobility, non-tender              Adnexa: no mass, fullness, tenderness               Rectovaginal: Confirms               Anus:  normal sphincter tone, no lesions  Chaperone was present for exam.  A:  Well Woman with normal exam  Vaginal atrophy  P:   Pap with hpv  Mammogram just done  Colonoscopy UTD  Discussed breast self exam  Discussed calcium and vit D intake  Start vagifem

## 2016-01-06 LAB — IPS PAP TEST WITH HPV

## 2016-04-09 ENCOUNTER — Ambulatory Visit: Payer: BLUE CROSS/BLUE SHIELD | Admitting: *Deleted

## 2016-10-22 ENCOUNTER — Telehealth: Payer: Self-pay | Admitting: Internal Medicine

## 2016-10-22 NOTE — Telephone Encounter (Signed)
Pt needs her Synthroid refilled to the [facility] on [facility] [address] [facility], she requests three months, it is much cheaper.

## 2016-10-25 ENCOUNTER — Other Ambulatory Visit: Payer: Self-pay

## 2016-10-25 MED ORDER — SYNTHROID 125 MCG PO TABS: ORAL_TABLET | ORAL | 1 refills | Status: DC

## 2016-10-25 NOTE — Telephone Encounter (Signed)
Submitted

## 2016-12-02 ENCOUNTER — Telehealth: Payer: Self-pay

## 2016-12-02 ENCOUNTER — Telehealth: Payer: Self-pay | Admitting: Internal Medicine

## 2016-12-02 ENCOUNTER — Other Ambulatory Visit: Payer: Self-pay

## 2016-12-02 DIAGNOSIS — E039 Hypothyroidism, unspecified: Secondary | ICD-10-CM

## 2016-12-02 NOTE — Telephone Encounter (Signed)
Pt called and said that she is going to see her PCP, Kriste BasqueHannah Kim on 12/16/2016 and that she would like to go ahead and have her lab work that Dr. Elvera LennoxGherghe will want done when she is there if we could put in the orders.

## 2016-12-02 NOTE — Telephone Encounter (Signed)
Called and notified patient we had submitted her labs.

## 2016-12-02 NOTE — Telephone Encounter (Signed)
Submitted orders, and notified patient.

## 2016-12-02 NOTE — Telephone Encounter (Signed)
Please advise if okay, and if so which labs. Thank you!

## 2016-12-02 NOTE — Telephone Encounter (Signed)
OK to order a TSH and Ft4

## 2016-12-09 ENCOUNTER — Encounter: Payer: BLUE CROSS/BLUE SHIELD | Admitting: Family Medicine

## 2016-12-15 NOTE — Progress Notes (Signed)
HPI:  Here for CPE:  Sees gyn for women's/gyn exams. Had pap 12/2015.  New Issue: -exertional chest pain -has noticed the last few months -only occurs with exercise  -pressure discomfort in chest - she has to stop - relieved with rest -accompanied by mild SOB -FH: father had "stents", mother had "angina" at a young age, both brother had heart surgery - but she thinks was valvular -prior palpitations  Chronic conditions:  Hypothyroidism: -dx graves 2000, s/p RAI ablation -seeing Dr. Elvera Lennox in endocrinology -meds: synthroid -reports: doing well, wants to draw thyroid labs here for her upcoming visit -denies: palpitations, hair or skin changes, unintentional changes in weight  Hx Palpitations: -meds: asa, she stopped the atenolol in 11/2013 -reports:doing well  Hx Central Sensitization Disorder/Fibromyalgia: -on savella in the past, weaning off after doing much better on plant based diet -stopped savella in 11/2013  Vaginal atrophy: -seeing gyn  -Diet: variety of foods, balance and well rounded, mostly vegan  -Exercise: no regular exercise - some biking  -Taking folic acid, vitamin D or calcium: taking b12 and vit D  -Vaccines: UTD  -wants STI testing (Hep C if born [birth year]): no  -FH breast, colon or ovarian ca: see FH Last mammogram: sees gyn Last colon cancer screening: 9 years ago and normal per her report   -Alcohol, Tobacco, drug use: see social history  Review of Systems - no fevers, unintentional weight loss, vision loss, hearing loss, chest pain, sob, hemoptysis, melena, hematochezia, hematuria, genital discharge, changing or concerning skin lesions, bleeding, bruising, loc, thoughts of self harm or SI  Past Medical History:  Diagnosis Date  . Cardiac arrhythmia   . Chicken pox   . DDD (degenerative disc disease)   . Graves disease   . Polyarthralgia   . Thyroid disease   . UTI (urinary tract infection)     Past Surgical History:   Procedure Laterality Date  . WISDOM TOOTH EXTRACTION      Family History  Problem Relation Age of Onset  . Arthritis Mother   . Hyperlipidemia Mother   . Hypertension Mother   . Diabetes Mother   . Heart disease Father   . Bipolar disorder Father   . Hypertension Father   . Hyperlipidemia Father   . Sudden death Maternal Grandmother   . Tuberculosis Maternal Grandmother   . Sudden death Paternal Grandfather   . Schizophrenia Brother   . Depression Unknown        brothers and sisters     Social History   Social History  . Marital status: Married    Spouse name: N/A  . Number of children: N/A  . Years of education: N/A   Social History Main Topics  . Smoking status: Never Smoker  . Smokeless tobacco: Never Used  . Alcohol use 0.6 - 1.2 oz/week    1 - 2 Standard drinks or equivalent per week     Comment: socially   . Drug use: No  . Sexual activity: Yes    Partners: Male   Other Topics Concern  . 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  CALCIUM PO, Take by mouth daily., Disp: , Rfl:  .  Cholecalciferol (VITAMIN D-3 PO), Take 1 capsule by mouth daily., Disp: , Rfl:  .  Cyanocobalamin (B-12 PO), Take by mouth., Disp: , Rfl:  .  SYNTHROID 125 MCG tablet, TAKE 1 TABLET BY MOUTH DAILY BEFORE BREAKFAST, Disp: 90 tablet, Rfl: 1  EXAM:  Vitals:   12/16/16 0702  BP: 100/76  Pulse: 62  Temp: 97.8 F (36.6 C)    GENERAL: vitals reviewed and listed below, alert, oriented, appears well hydrated and in no acute  distress  HEENT: head atraumatic, PERRLA, normal appearance of eyes, ears, nose and mouth. moist mucus membranes.  NECK: supple, no masses or lymphadenopathy  LUNGS: clear to auscultation bilaterally, no rales, rhonchi or wheeze  CV: HRRR, no peripheral edema or cyanosis, normal pedal pulses  ABDOMEN: bowel sounds normal, soft, non tender to palpation, no masses, no rebound or guarding  SKIN: no rash or abnormal lesions  MS: normal gait, moves all extremities normally  GU/BREAST: declined - does with gyn  NEURO: normal gait, speech and thought processing grossly intact, muscle tone grossly intact throughout  PSYCH: normal affect, pleasant and cooperative  ASSESSMENT AND PLAN:  Discussed the following assessment and plan:  Encounter for preventive health examination - Plan: Hemoglobin A1c  -Discussed and advised all [place] preventive services health task force level A and B recommendations for age, sex and risks.  -Advised at least 150 minutes of exercise per week and a healthy diet with avoidance of (less then 1 serving per week) processed foods, white starches, red meat, fast foods and sweets and consisting of: * 5-9 servings of fresh fruits and vegetables (not corn or potatoes) *nuts and seeds, beans *olives and olive oil *lean meats such as fish and white chicken  *whole grains  -labs, studies and vaccines per orders this encounter  Exertional chest pain - Plan: Ambulatory referral to Cardiology -we discussed possible serious and likely etiologies, workup and treatment, treatment risks and return precautions - while this could be deconditioning, symptoms concerning for cardiac etiology and advised EKG and further testing/eval for ishcemia -after this discussion, Brittany Moran opted for referral to cardiology, she declined/refused and EKG today here  -follow up advised per pt insturctions -of course, we advised Brittany Moran  to return or notify a doctor or seek emergency care as  needed/immediately if symptoms worsen or persist or new concerns arise.  Dyslipidemia - Plan: Lipid panel -lifestyle recs, labs  Hypothyroidism, acquired -ok to check for endo appt  - pt reports orders in, advised she let lab know  Encounter for hepatitis C screening test for low risk patient - Plan: Hepatitis C antibody, Hemoglobin A1c  Hypothyroidism, unspecified type - Plan: TSH, T4, free    Orders Placed This Encounter  Procedures  . Lipid panel  . Hepatitis C antibody  . Hemoglobin A1c  . Ambulatory referral to Cardiology    Referral Priority:   Urgent    Referral Type:   Consultation    Referral Reason:   Specialty Services Required    Requested Specialty:   Cardiology    Number of Visits Requested:   1  Patient advised to return to clinic immediately if symptoms worsen or persist or new concerns.  Patient Instructions  BEFORE YOU LEAVE: -follow up: yearly and as needed -labs  -We placed a referral for you as discussed to the cardiologist about the chest discomfort. It usually takes about 1 week to process and schedule this referral. If you have not heard from [place] regarding this appointment in 1 week please contact our office. No strenuous activity in the meantime. Please seek emergency care if worsening, new concerns or persistent symptoms.  We have ordered labs or studies at this visit. It can take up to 1-2 weeks for results and processing. IF results require follow up or explanation, we will call you with instructions. Clinically stable results will be released to your [facility]. If you have not heard from [place] or cannot find your results in [facility] in 2 weeks please contact our office at [phone].  If you are not yet signed up for [facility], please consider signing up.  WE NOW OFFER   [facility] FAST TRACK!!!  SAME DAY Appointments for ACUTE CARE  Such as: Sprains, Injuries, cuts, abrasions, rashes, muscle pain, joint pain, back pain Colds, flu,  sore throats, headache, allergies, cough, fever  Ear pain, sinus and eye infections Abdominal pain, nausea, vomiting, diarrhea, upset stomach Animal/insect bites  3 Easy Ways to Schedule: Walk-In Scheduling Call in scheduling Mychart Sign-up: [email or web address]                No Follow-up on file.  Kriste Basque R., DO

## 2016-12-16 ENCOUNTER — Ambulatory Visit (INDEPENDENT_AMBULATORY_CARE_PROVIDER_SITE_OTHER): Payer: BLUE CROSS/BLUE SHIELD | Admitting: Family Medicine

## 2016-12-16 ENCOUNTER — Encounter: Payer: Self-pay | Admitting: Family Medicine

## 2016-12-16 VITALS — BP 100/76 | HR 62 | Temp 97.8°F | Ht 68.5 in | Wt 179.6 lb

## 2016-12-16 DIAGNOSIS — E039 Hypothyroidism, unspecified: Secondary | ICD-10-CM | POA: Diagnosis not present

## 2016-12-16 DIAGNOSIS — Z1159 Encounter for screening for other viral diseases: Secondary | ICD-10-CM

## 2016-12-16 DIAGNOSIS — Z0001 Encounter for general adult medical examination with abnormal findings: Secondary | ICD-10-CM | POA: Diagnosis not present

## 2016-12-16 DIAGNOSIS — R079 Chest pain, unspecified: Secondary | ICD-10-CM

## 2016-12-16 DIAGNOSIS — E785 Hyperlipidemia, unspecified: Secondary | ICD-10-CM | POA: Diagnosis not present

## 2016-12-16 DIAGNOSIS — Z0000 Encounter for general adult medical examination without abnormal findings: Secondary | ICD-10-CM

## 2016-12-16 LAB — LIPID PANEL
CHOLESTEROL: 226 mg/dL — AB (ref 0–200)
HDL: 71.8 mg/dL (ref >39.00)
LDL CALC: 133 mg/dL — AB (ref 0–99)
NonHDL: 154.1
TRIGLYCERIDES: 104 mg/dL (ref 0.0–149.0)
Total CHOL/HDL Ratio: 3
VLDL: 20.8 mg/dL (ref 0.0–40.0)

## 2016-12-16 LAB — HEMOGLOBIN A1C: Hgb A1c MFr Bld: 5.4 % (ref 4.6–6.5)

## 2016-12-16 LAB — TSH: TSH: 1.24 u[IU]/mL (ref 0.35–4.50)

## 2016-12-16 LAB — HEPATITIS C ANTIBODY: HCV Ab: NEGATIVE

## 2016-12-16 LAB — T4, FREE: FREE T4: 1.15 ng/dL (ref 0.60–1.60)

## 2016-12-16 NOTE — Patient Instructions (Addendum)
BEFORE YOU LEAVE: -follow up: yearly and as needed -labs  -We placed a referral for you as discussed to the cardiologist about the chest discomfort. It usually takes about 1 week to process and schedule this referral. If you have not heard from us regarding this appointment in 1 week please contact our office. No strenuous activity in the meantime. Please seek emergency care if worsening, new concerns or persistent symptoms.  We have ordered labs or studies at this visit. It can take up to 1-2 weeks for results and processing. IF results require follow up or explanation, we will call you with instructions. Clinically stable results will be released to your [facility]. If you have not heard from us or cannot find your results in [facility] in 2 weeks please contact our office at [phone].  If you are not yet signed up for [facility], please consider signing up.  WE NOW OFFER    [facility] FAST TRACK!!!  SAME DAY Appointments for ACUTE CARE  Such as: Sprains, Injuries, cuts, abrasions, rashes, muscle pain, joint pain, back pain Colds, flu, sore throats, headache, allergies, cough, fever  Ear pain, sinus and eye infections Abdominal pain, nausea, vomiting, diarrhea, upset stomach Animal/insect bites  3 Easy Ways to Schedule: Walk-In Scheduling Call in scheduling Mychart Sign-up: [email or web address]

## 2016-12-23 ENCOUNTER — Ambulatory Visit (INDEPENDENT_AMBULATORY_CARE_PROVIDER_SITE_OTHER): Payer: BLUE CROSS/BLUE SHIELD | Admitting: Internal Medicine

## 2016-12-23 ENCOUNTER — Encounter: Payer: Self-pay | Admitting: Internal Medicine

## 2016-12-23 VITALS — BP 114/72 | HR 63 | Wt 179.0 lb

## 2016-12-23 DIAGNOSIS — E039 Hypothyroidism, unspecified: Secondary | ICD-10-CM

## 2016-12-23 NOTE — Patient Instructions (Signed)
Please continue Synthroid 125 mcg daily.  Take the thyroid hormone every day, with water, at least 30 minutes before breakfast, separated by at least 4 hours from: - acid reflux medications - calcium - iron - multivitamins  Please stop at the lab.  Please return in 1 year.

## 2016-12-23 NOTE — Progress Notes (Signed)
Patient ID: Brittany Moran, female   DOB: 1956-05-17, 61 y.o.   MRN: [id number]   HPI  Brittany Moran is a 61 y.o.-year-old female,returning for f/u for postablative hypothyroidism. Last visit 1 year ago.  Reviewed hx: Pt. has been dx with Graves ds. in 2001 - s/p RAI Tx in 2005. She developed hypothyroidism in 2001; is on Synthroid DAW 125 mcg - saw [facility] >> LT3 added - but she stopped in 10/2013 as her TSH was suppressed.  She takes the Synthroid DAW 125 mcg: - in am - fasting - at least 30-60 min from b'fast - no Fe, MVI, PPIs - + Ca in the pm - not on Biotin  I reviewed pt's thyroid tests: Lab Results  Component Value Date   TSH 1.24 12/16/2016   TSH 0.66 11/26/2015   TSH 0.75 11/21/2015   TSH 0.59 12/27/2014   TSH 0.96 04/04/2014   TSH 0.61 02/12/2014   FREET4 1.15 12/16/2016   FREET4 1.14 11/21/2015   FREET4 1.14 12/27/2014   FREET4 1.13 04/04/2014    Pt denies: - feeling nodules in neck - hoarseness - dysphagia - choking - SOB with lying down  She also has a history of an uncharacterized autoimmune disorder - has been investigated extensively, finally dx'ed with fibromyalgia - got better after starting vegan diet (she eats fish once a week). She also has MTFHR def. >> started Me-cobalamine, but now stopped for several months.   ROS: Constitutional: no weight gain/no weight loss, no fatigue, no subjective hyperthermia, no subjective hypothermia Eyes: no blurry vision, no xerophthalmia ENT: no sore throat, no nodules palpated in throat, no dysphagia, no odynophagia, no hoarseness Cardiovascular: no CP/no SOB/no palpitations/no leg swelling Respiratory: no cough/no SOB/no wheezing Gastrointestinal: no N/no V/no D/no C/no acid reflux Musculoskeletal: no muscle aches/+ joint aches Skin: no rashes, no hair loss Neurological: no tremors/no numbness/no tingling/no dizziness  I reviewed pt's medications, allergies, PMH, social hx, family hx, and  changes were documented in the history of present illness. Otherwise, unchanged from my initial visit note.   Past Medical History:  Diagnosis Date  . Cardiac arrhythmia   . Chicken pox   . DDD (degenerative disc disease)   . Graves disease   . Polyarthralgia   . Thyroid disease   . UTI (urinary tract infection)    Past Surgical History:  Procedure Laterality Date  . WISDOM TOOTH EXTRACTION     History   Social History  . Marital Status: Married    Spouse Name: N/A    Number of Children: 2   Social History Main Topics  . Smoking status: Never Smoker   . Smokeless tobacco: Never Used  . Alcohol Use: 0.5 oz/week    1 drink(s) per week     Comment: socially   . 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 CALCIUM PO Take by mouth daily.    . Cholecalciferol (VITAMIN D-3 PO) Take 1 capsule by mouth daily.    . Cyanocobalamin (B-12 PO) Take by mouth.    Marland Kitchen  SYNTHROID 125 MCG tablet TAKE 1 TABLET BY MOUTH DAILY BEFORE BREAKFAST 90 tablet 1   No current facility-administered medications on file prior to visit.    No Known Allergies Family History  Problem Relation Age of Onset  . Arthritis Mother   . Hyperlipidemia Mother   . Hypertension Mother   . Diabetes Mother   . Heart disease Father   . Bipolar disorder Father   . Hypertension Father   . Hyperlipidemia Father   . Sudden death Maternal Grandmother   . Tuberculosis Maternal Grandmother   . Sudden death Paternal Grandfather   . Schizophrenia Brother   . Depression Unknown        brothers and sisters    PE: BP 114/72  (BP Location: Left Arm, Patient Position: Sitting)   Pulse 63   Wt 179 lb (81.2 kg)   SpO2 96%   BMI 26.82 kg/m  Wt Readings from Last 3 Encounters:  12/23/16 179 lb (81.2 kg)  12/16/16 179 lb 9.6 oz (81.5 kg)  01/01/16 182 lb (82.6 kg)   Constitutional: sl. overweight, in NAD Eyes: PERRLA, EOMI, no exophthalmos ENT: moist mucous membranes, no thyromegaly, no cervical lymphadenopathy Cardiovascular: RRR, No MRG Respiratory: CTA B Gastrointestinal: abdomen soft, NT, ND, BS+ Musculoskeletal: no deformities, strength intact in all 4 Skin: moist, warm, no rashes Neurological: no tremor with outstretched hands, DTR normal in all 4   ASSESSMENT: 1. Hypothyroidism  PLAN:  1. Hypothyroidism - latest thyroid labs (from 11/2016) reviewed with pt >> normal. She was wondering why the TSH was higher >> explained that TSH usually fluctuates in the normal range. We also reviewed the pituitary-thyroid feedback loops. - she continues on Synthroid 125 mcg daily - pt feels good on this dose. - we discussed about taking the thyroid hormone every day, with water, >30 minutes before breakfast, separated by >4 hours from acid reflux medications, calcium, iron, multivitamins. Pt. is taking it correctly. - will check thyroid tests at next visit - RTC in 1 year  Carlus Pavlovristina Dontae Minerva, MD PhD [facility]

## 2017-01-31 ENCOUNTER — Encounter: Payer: Self-pay | Admitting: Interventional Cardiology

## 2017-01-31 ENCOUNTER — Ambulatory Visit (INDEPENDENT_AMBULATORY_CARE_PROVIDER_SITE_OTHER): Payer: BLUE CROSS/BLUE SHIELD | Admitting: Interventional Cardiology

## 2017-01-31 VITALS — BP 106/60 | HR 68 | Ht 68.5 in | Wt 180.4 lb

## 2017-01-31 DIAGNOSIS — R0789 Other chest pain: Secondary | ICD-10-CM

## 2017-01-31 NOTE — Progress Notes (Addendum)
Cardiology Office Note   Date:  01/31/2017   ID:  Brittany CanterburyJeanette Downs, DOB Jan 23, 1956, MRN [id number]  PCP:  Terressa KoyanagiKim, Hannah R, DO    Chief Complaint  Patient presents with  . New Patient (Initial Visit)    chest pressure but not pain     Wt Readings from Last 3 Encounters:  01/31/17 180 lb 6.4 oz (81.8 kg)  12/23/16 179 lb (81.2 kg)  12/16/16 179 lb 9.6 oz (81.5 kg)       History of Present Illness: Brittany Moran is a 61 y.o. female  Who has had no cardiac history, with the exception of palpitations.  She had premature beats and they resolved with atenolol.  She stopped the atenolol a few years ago, but now only with rare skipped beats.    She notes having some pressure when she feels her HR is increased.  She walks for exercise and has no problems.  She can feel it on an exercise bike.  She cannot tell the frequency because she has not been paying attention.  Sx have been going on for years, but she just mentioned it recently at her PMD visit.    She has noted sx during intercourse as well.    Many years ago, she had a stress test and Holter monitor.  Stress test was about 10 years ago.  Two brothers with heart valve surgery.  Father had stents while in his 7170s.    Brittany Moran is a 61 y.o. female who is being seen today for the evaluation of palpitations and chest pressure at the request of Terressa KoyanagiKim, Hannah R, DO.       Past Medical History:  Diagnosis Date  . Cardiac arrhythmia   . Chicken pox   . DDD (degenerative disc disease)   . Dry eyes 09/10/2013  . Fibromyalgia 09/10/2013  . Graves disease   . Hypothyroidism, acquired 09/10/2013  . Palpitations 09/10/2013  . Polyarthralgia   . Thyroid disease   . UTI (urinary tract infection)     Past Surgical History:  Procedure Laterality Date  . WISDOM TOOTH EXTRACTION       Current Outpatient Prescriptions  Medication Sig Dispense Refill  . CALCIUM PO Take 1 tablet by mouth daily.     . Cholecalciferol  (VITAMIN D-3 PO) Take 1 capsule by mouth daily.    . Cyanocobalamin (B-12 PO) Take 1 tablet by mouth daily.     Marland Kitchen. SYNTHROID 125 MCG tablet TAKE 1 TABLET BY MOUTH DAILY BEFORE BREAKFAST 90 tablet 1   No current facility-administered medications for this visit.     Allergies:   Patient has no known allergies.    Social History:  The patient  reports that she has never smoked. She has never used smokeless tobacco. She reports that she drinks about 0.6 - 1.2 oz of alcohol per week . She reports that she does not use drugs.   Family History:  The patient's family history includes Arthritis in her mother; Bipolar disorder in her father; Depression in her unknown relative; Diabetes in her mother; Heart disease in her father; Hyperlipidemia in her father and mother; Hypertension in her father and mother; Schizophrenia in her brother; Sudden death in her maternal grandmother and paternal grandfather; Tuberculosis in her maternal grandmother.    ROS:  Please see the history of present illness.   Otherwise, review of systems are positive for chest pressure, not pain.   All other systems are reviewed and negative.  PHYSICAL EXAM: VS:  BP 106/60   Pulse 68   Ht 5' 8.5" (1.74 m)   Wt 180 lb 6.4 oz (81.8 kg)   BMI 27.03 kg/m  , BMI Body mass index is 27.03 kg/m. GEN: Well nourished, well developed, in no acute distress  HEENT: normal  Neck: no JVD, carotid bruits, or masses Cardiac: RRR; no murmurs, rubs, or gallops,no edema  Respiratory:  clear to auscultation bilaterally, normal work of breathing GI: soft, nontender, nondistended, + BS MS: no deformity or atrophy  Skin: warm and dry, no rash Neuro:  Strength and sensation are intact Psych: euthymic mood, full affect   EKG:   The ekg ordered today demonstrates NSR, no ST segment changes   Recent Labs: 12/16/2016: TSH 1.24   Lipid Panel    Component Value Date/Time   CHOL 226 (H) 12/16/2016 0752   TRIG 104.0 12/16/2016 0752   HDL  71.80 12/16/2016 0752   CHOLHDL 3 12/16/2016 0752   VLDL 20.8 12/16/2016 0752   LDLCALC 133 (H) 12/16/2016 0752     Other studies Reviewed: Additional studies/ records that were reviewed today with results demonstrating: lipids as above .   ASSESSMENT AND PLAN:  1. Chest pressure: Plan for exercise treadmill test to evaluate for ischemia.  Due to her high deductible health plan, she wants to avoid expensive testing if possible.  Some typical features and some atypical features. She does not have many significant risk factors for heart disease. She eats a vegetarian diet as well.  She will avoid strenuous exercise for now until the stress test. 2. Borderline cholesterol found recently in May 2018. For now, will work on diet and exercise. HDL is quite high.    Current medicines are reviewed at length with the patient today.  The patient concerns regarding her medicines were addressed.  The following changes have been made:  No change  Labs/ tests ordered today include: Exercise treadmill test  No orders of the defined types were placed in this encounter.   Recommend 150 minutes/week of aerobic exercise Low fat, low carb, high fiber diet recommended  Disposition:   FU in for stress test   Signed, Lance Muss, MD  01/31/2017 8:51 AM    [facility] [address] [place], [place], [place]  [address] Phone: [phone]; Fax: [phone]

## 2017-01-31 NOTE — Patient Instructions (Signed)
Medication Instructions:  Your physician recommends that you continue on your current medications as directed. Please refer to the Current Medication list given to you today.   Labwork: None ordered  Testing/Procedures: Your physician has requested that you have an exercise tolerance test. For further information please visit [email or web address]. Please also follow instruction sheet, as given.   Follow-Up: Based on test results   Any Other Special Instructions Will Be Listed Below (If Applicable).     If you need a refill on your cardiac medications before your next appointment, please call your pharmacy.   

## 2017-02-03 IMAGING — MG DIGITAL SCREENING BILATERAL MAMMOGRAM WITH CAD
4 series · 4 of 4 positions shown · non-contrast
Comparison: Previous exam(s).

CLINICAL DATA: Screening.

EXAM:
DIGITAL SCREENING BILATERAL MAMMOGRAM WITH CAD

[R CC]
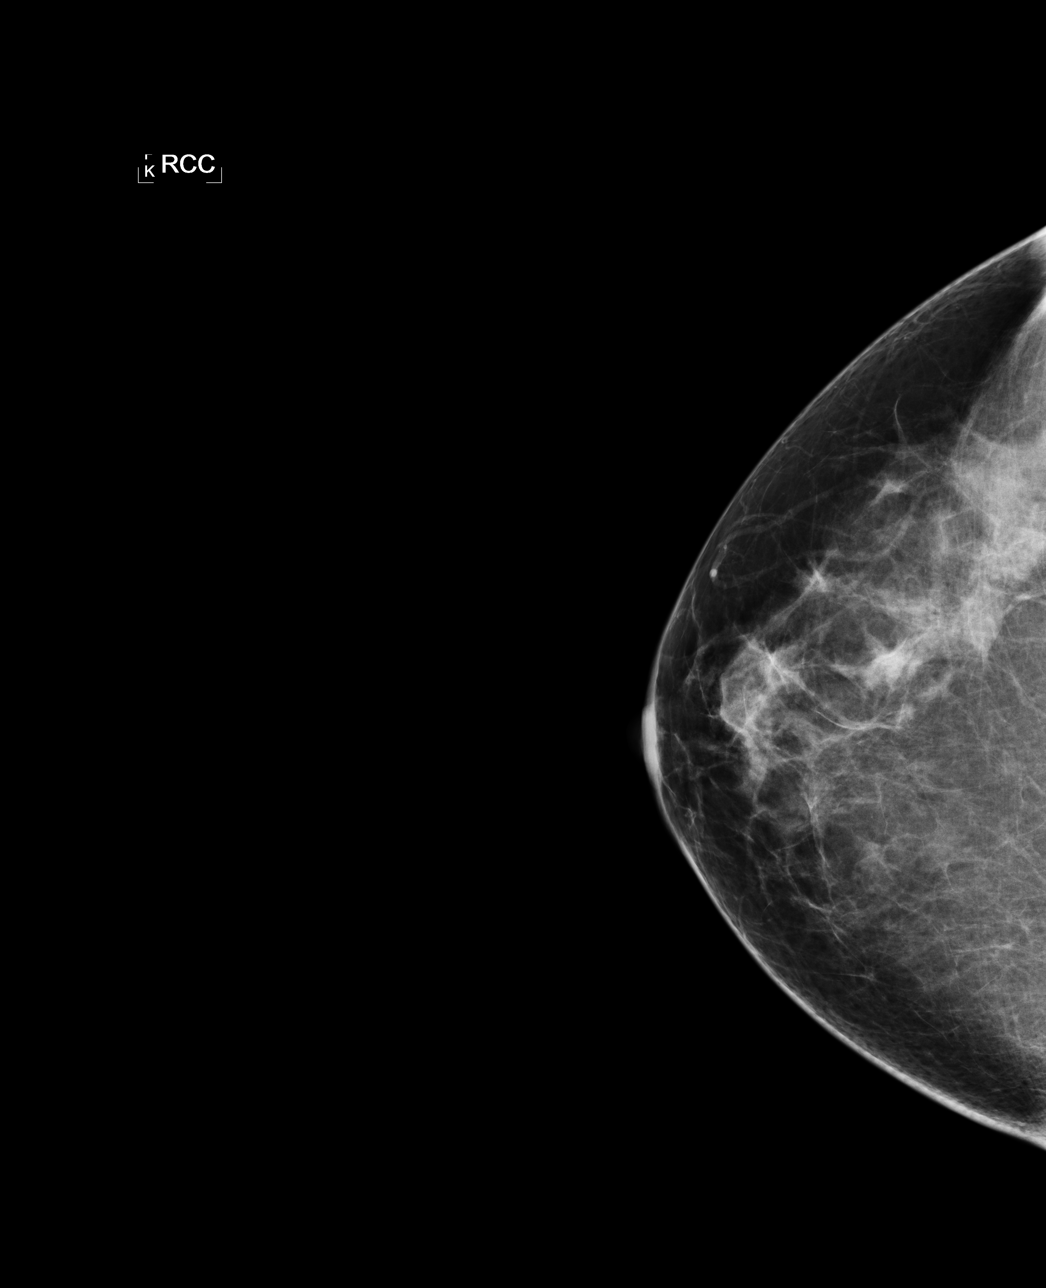

[L CC]
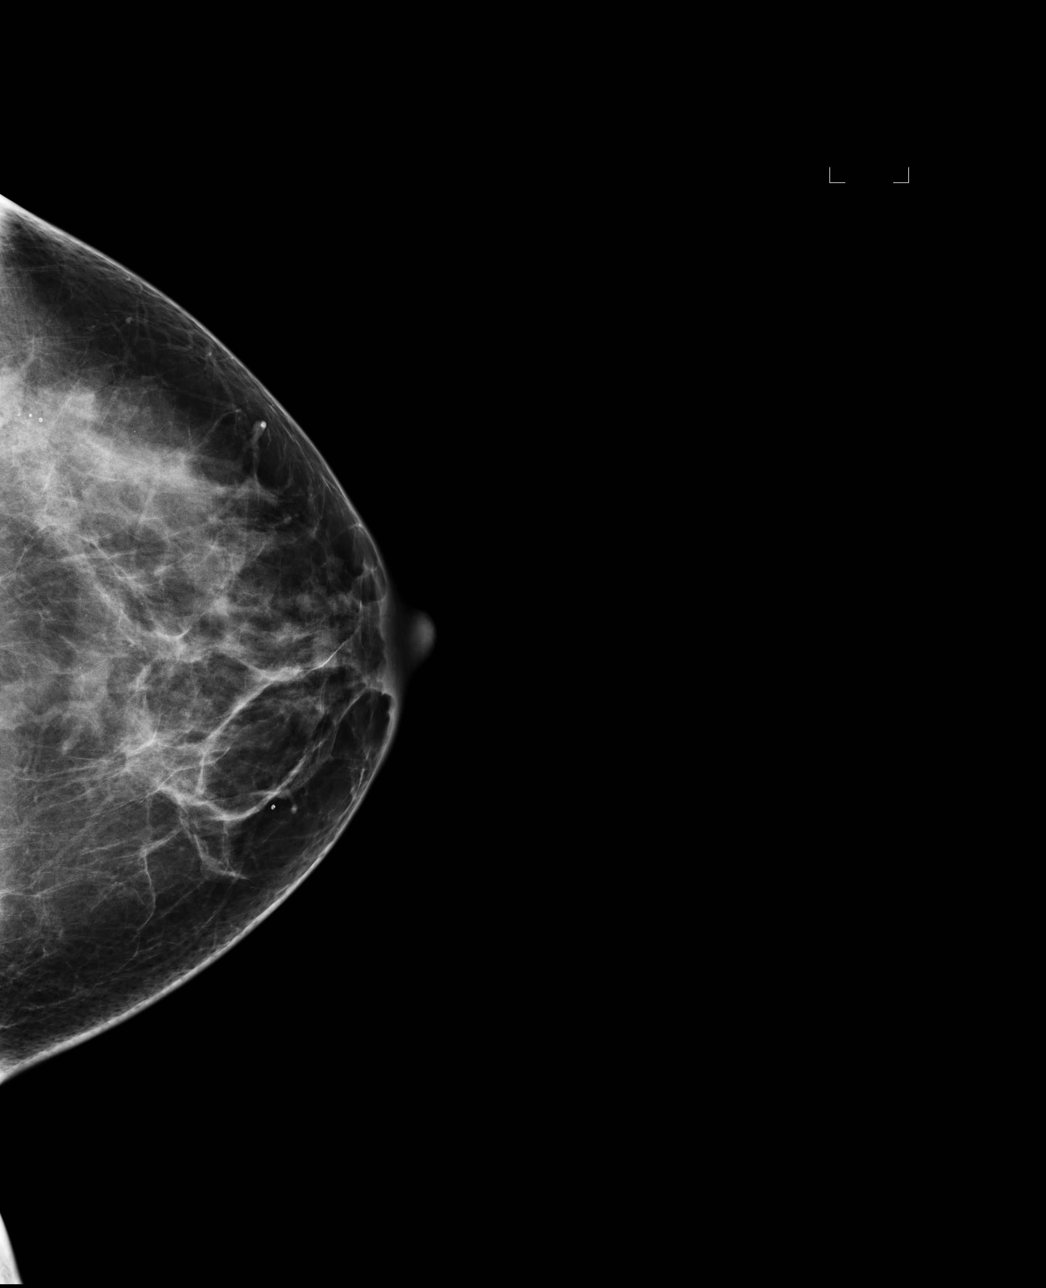

[L MLO]
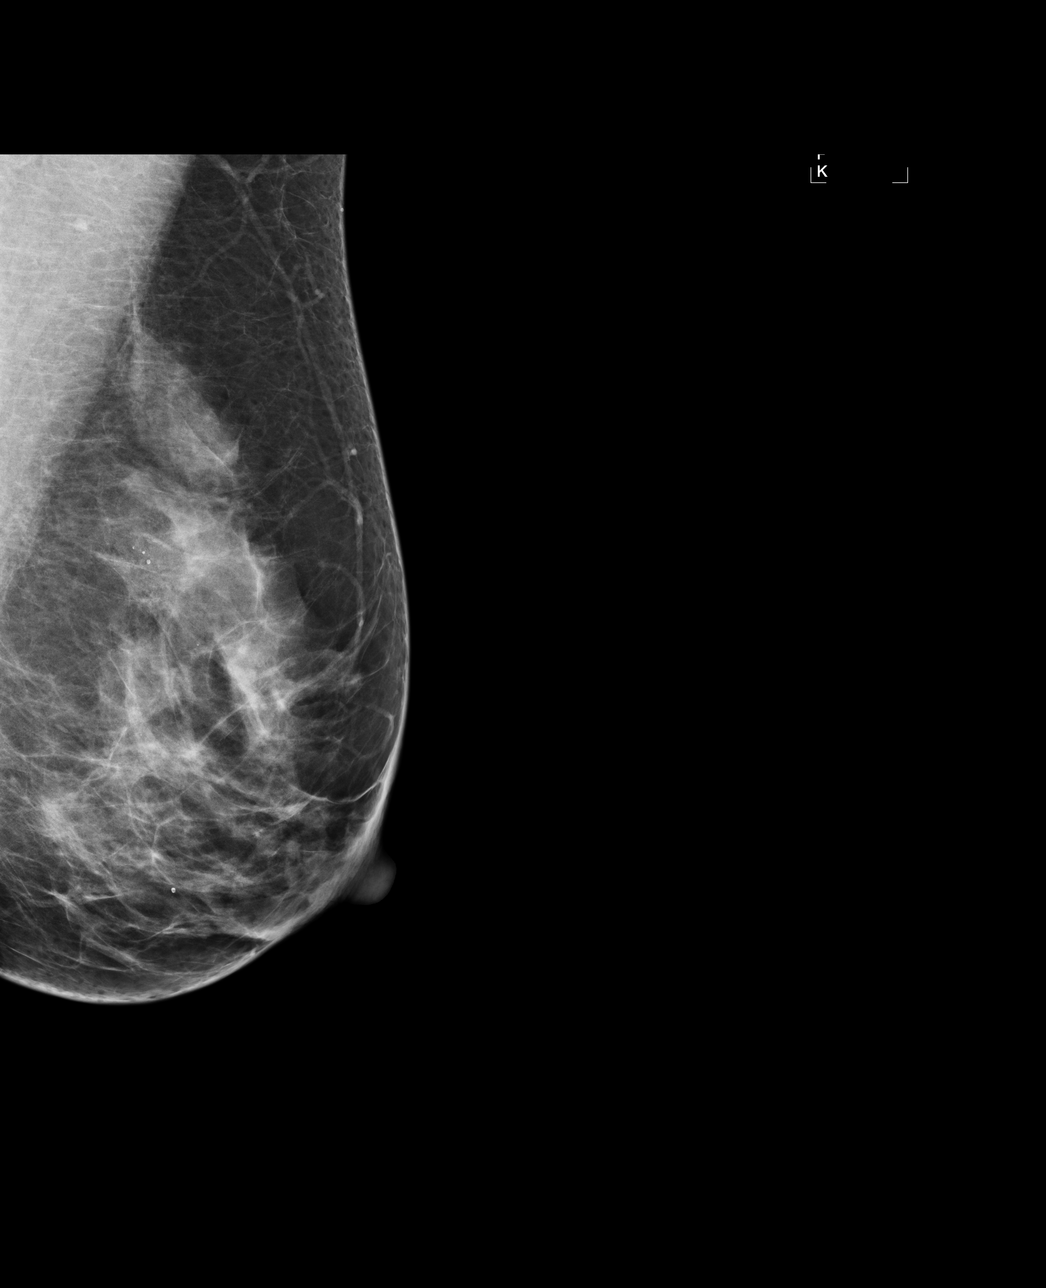

[R MLO]
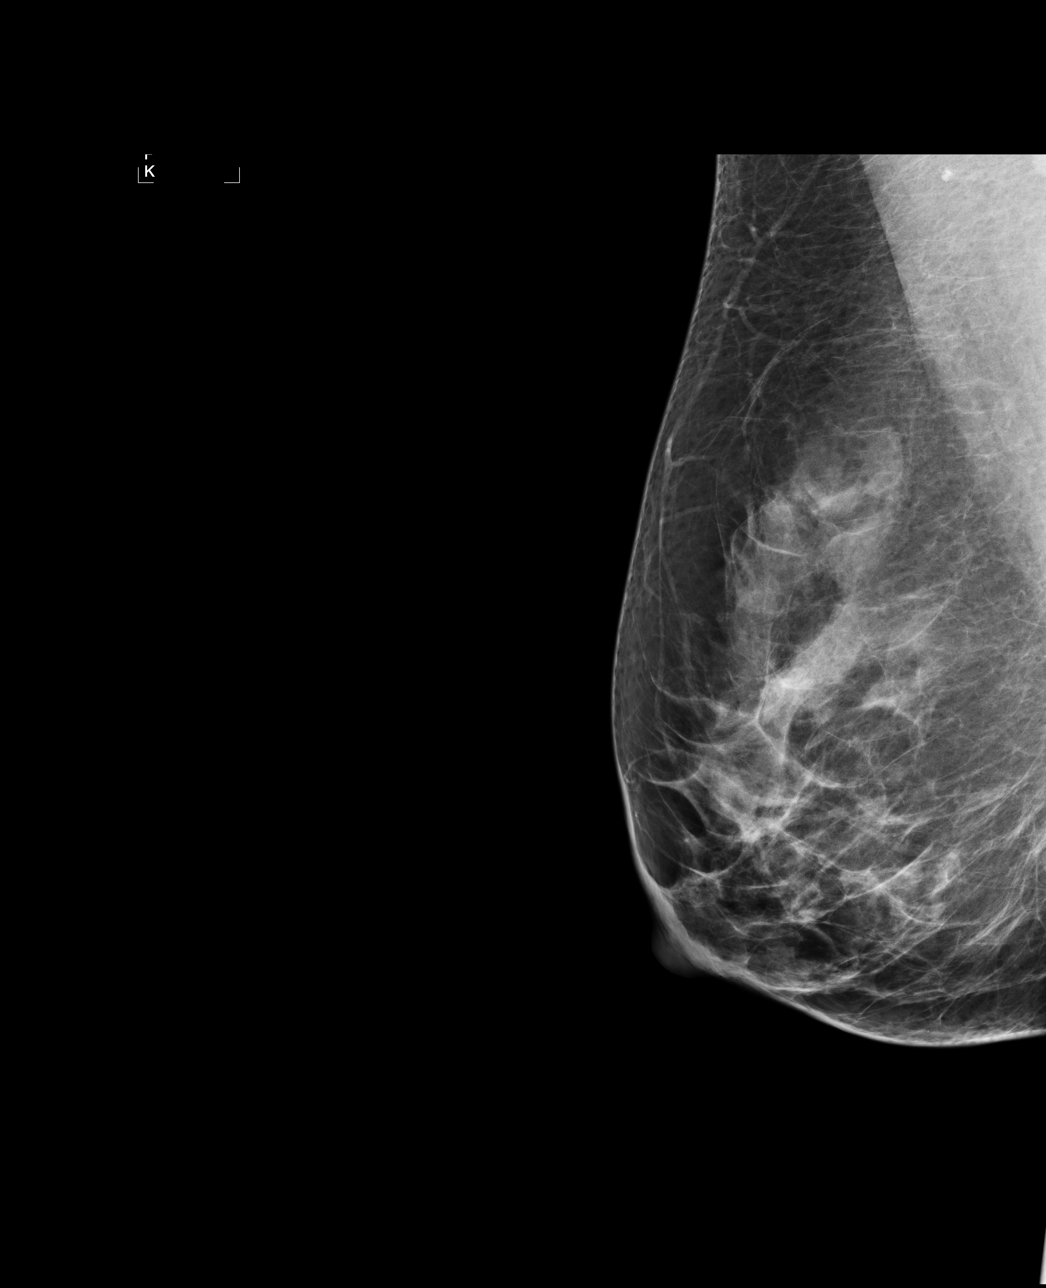

[4 of 4 positions shown; findings below may reference images not displayed]

ACR Breast Density Category c: The breast tissue is heterogeneously
dense, which may obscure small masses.
FINDINGS: There are no findings suspicious for malignancy. Images were
processed with CAD.
IMPRESSION: No mammographic evidence of malignancy. A result letter of this
screening mammogram will be mailed directly to the patient.

RECOMMENDATION:
Screening mammogram in one year. (Code:YJ-2-FEZ)

BI-RADS CATEGORY  1: Negative.

## 2017-02-25 ENCOUNTER — Telehealth: Payer: Self-pay

## 2017-02-25 ENCOUNTER — Ambulatory Visit (INDEPENDENT_AMBULATORY_CARE_PROVIDER_SITE_OTHER): Payer: BLUE CROSS/BLUE SHIELD | Admitting: Internal Medicine

## 2017-02-25 ENCOUNTER — Other Ambulatory Visit: Payer: Self-pay

## 2017-02-25 ENCOUNTER — Other Ambulatory Visit: Payer: BLUE CROSS/BLUE SHIELD | Admitting: *Deleted

## 2017-02-25 ENCOUNTER — Encounter: Payer: Self-pay | Admitting: Internal Medicine

## 2017-02-25 ENCOUNTER — Ambulatory Visit (INDEPENDENT_AMBULATORY_CARE_PROVIDER_SITE_OTHER): Payer: BLUE CROSS/BLUE SHIELD

## 2017-02-25 VITALS — BP 150/94 | HR 68

## 2017-02-25 DIAGNOSIS — E7849 Other hyperlipidemia: Secondary | ICD-10-CM

## 2017-02-25 DIAGNOSIS — R9439 Abnormal result of other cardiovascular function study: Secondary | ICD-10-CM | POA: Diagnosis not present

## 2017-02-25 DIAGNOSIS — E784 Other hyperlipidemia: Secondary | ICD-10-CM | POA: Diagnosis not present

## 2017-02-25 DIAGNOSIS — I208 Other forms of angina pectoris: Secondary | ICD-10-CM

## 2017-02-25 DIAGNOSIS — R079 Chest pain, unspecified: Secondary | ICD-10-CM

## 2017-02-25 DIAGNOSIS — R0789 Other chest pain: Secondary | ICD-10-CM | POA: Diagnosis not present

## 2017-02-25 LAB — EXERCISE TOLERANCE TEST
CHL CUP MPHR: 159 {beats}/min
CHL CUP RESTING HR STRESS: 60 {beats}/min
CSEPEDS: 7 s
CSEPHR: 87 %
Estimated workload: 7 METS
Exercise duration (min): 5 min
Peak HR: 139 {beats}/min
RPE: 12

## 2017-02-25 MED ORDER — METOPROLOL SUCCINATE ER 50 MG PO TB24: 50.0000 mg | ORAL_TABLET | Freq: Every day | ORAL | 3 refills | Status: DC

## 2017-02-25 NOTE — Progress Notes (Signed)
      Patient Care Team: Lucretia Kern, DO as PCP - General (Family Medicine)   HPI  Brittany Moran is a 61 y.o. female Seen as an add-on today following an abnormal standard treadmill test.  She is a 61 year old vegan and who has a rather chronic history of stable exertional chest discomfort. It is provoked with rapid onset of exercise as well as associated with sexual intercourse. It abates with slowing and gradual resumption. A little bit of shortness of breath. The discomfort radiates up into her neck and not further.  She has a history of palpitations for which she was treated with atenolol. A few years ago. This has not been much of her problem.  Family history is notable for coronary disease in her father and her brothers have had valve surgery.  Records and Results Reviewed As Below   Past Medical History:  Diagnosis Date  . Cardiac arrhythmia   . Chicken pox   . DDD (degenerative disc disease)   . Dry eyes 09/10/2013  . Fibromyalgia 09/10/2013  . Graves disease   . Hypothyroidism, acquired 09/10/2013  . Palpitations 09/10/2013  . Polyarthralgia   . Thyroid disease   . UTI (urinary tract infection)     Past Surgical History:  Procedure Laterality Date  . WISDOM TOOTH EXTRACTION      Current Outpatient Prescriptions  Medication Sig Dispense Refill  . CALCIUM PO Take 1 tablet by mouth daily.     . Cholecalciferol (VITAMIN D-3 PO) Take 1 capsule by mouth daily.    . Cyanocobalamin (B-12 PO) Take 1 tablet by mouth daily.     . metoprolol succinate (TOPROL-XL) 50 MG 24 hr tablet Take 1 tablet (50 mg total) by mouth daily. Take with or immediately following a meal. 90 tablet 3  . SYNTHROID 125 MCG tablet TAKE 1 TABLET BY MOUTH DAILY BEFORE BREAKFAST 90 tablet 1   No current facility-administered medications for this visit.     No Known Allergies    Review of Systems negative except from HPI and PMH  Physical Exam BP (!) 150/94   Pulse 68  Well developed  and well nourished in no acute distress HENT normal E scleral and icterus clear Neck Supple JVP flat; carotids brisk and full Clear to ausculation Regular rate and rhythm, no murmurs gallops or rub Soft with active bowel sounds No clubbing cyanosis  Edema Alert and oriented, grossly normal motor and sensory function Skin Warm and Dry  Treadmill was personally reviewed and demonstrated to 3 mm of flat ST segment depression in inferolateral leads. Resolved by 2 minutes of recovery  Assessment and  Plan  Exertional chest pain  Abnormal treadmill test  Hyperlipidemia  Abnormal treadmill testing with her rather typical discomfort these are moderately high probability for coronary disease. I discussed options with her as well as Dr. Kendra Opitz.  We discussed further noninvasive testing i.e. CT angiography versus catheterization. We've elected to pursue the latter. We discussed risks and benefits.  We will begin her on aspirin as well as low-dose beta blockers. Side effects were reviewed.  Will be arranged with Dr. Saundra Shelling who returns next week. Current medicines are reviewed at length with the patient today .  The patient does not  have concerns regarding medicines.

## 2017-02-25 NOTE — Telephone Encounter (Signed)
Patient in the office for stress test. Katie discussed abnormal stress test with Dr. Graciela HusbandsKlein DOD. Dr. Graciela HusbandsKlein wanted patient to start metoprolol succinate 50 mg QD and for patient to be set up for a cath with Dr. Eldridge DaceVaranasi on 8/23. Patient agreeable and will have labs today while in the office. Scheduled patient for cath on 8/23 at 7:30AM with Dr. Eldridge DaceVaranasi. Instruction letter given to patient and instructions reviewed with patient. Instructed patient to call the office if she has any additional questions.

## 2017-02-26 LAB — CBC
Hematocrit: 38.7 % (ref 34.0–46.6)
Hemoglobin: 13.8 g/dL (ref 11.1–15.9)
MCH: 32.6 pg (ref 26.6–33.0)
MCHC: 35.7 g/dL (ref 31.5–35.7)
MCV: 92 fL (ref 79–97)
Platelets: 273 x10E3/uL (ref 150–379)
RBC: 4.23 x10E6/uL (ref 3.77–5.28)
RDW: 13.3 % (ref 12.3–15.4)
WBC: 7.2 x10E3/uL (ref 3.4–10.8)

## 2017-02-26 LAB — BASIC METABOLIC PANEL
BUN/Creatinine Ratio: 11 — ABNORMAL LOW (ref 12–28)
BUN: 9 mg/dL (ref 8–27)
CALCIUM: 9.8 mg/dL (ref 8.7–10.3)
CHLORIDE: 102 mmol/L (ref 96–106)
CO2: 23 mmol/L (ref 20–29)
CREATININE: 0.82 mg/dL (ref 0.57–1.00)
GFR calc Af Amer: 89 mL/min/{1.73_m2} (ref >59)
GFR calc non Af Amer: 77 mL/min/{1.73_m2} (ref >59)
GLUCOSE: 74 mg/dL (ref 65–99)
Potassium: 3.9 mmol/L (ref 3.5–5.2)
Sodium: 142 mmol/L (ref 134–144)

## 2017-02-26 LAB — PROTIME-INR
INR: 1 (ref 0.8–1.2)
Prothrombin Time: 10.2 s (ref 9.1–12.0)

## 2017-02-27 NOTE — Telephone Encounter (Signed)
Yes. She will need OV.

## 2017-03-01 NOTE — Telephone Encounter (Signed)
Clarified with Dr. Eldridge DaceVaranasi.  Since pt seen 8/10 by Dr. Graciela HusbandsKlein, pt does not require OV at this time.

## 2017-03-04 ENCOUNTER — Telehealth: Payer: Self-pay | Admitting: Interventional Cardiology

## 2017-03-04 NOTE — Telephone Encounter (Signed)
New message    Pt is returning call to [place] about results. Please call after 12.

## 2017-03-04 NOTE — Telephone Encounter (Signed)
-----   Message from Corky Crafts, MD sent at 02/27/2017  4:06 PM EDT ----- Abnormal stress test.  How are her sx?  Is she still having sx when her HR is increased? COuld add Imdur 30 mg daily if she still has sx.  If sx persist, would have to consider cath.

## 2017-03-04 NOTE — Telephone Encounter (Signed)
Called and spoke to patient. Patinet states that she has not had any symptoms. When patient was in the office for her stress test, she was advised to relax and not overexert herself so she has not done anything to increase her HR. Patient states that she only gets symptoms when her HR gets elevated. Patient is already scheduled for cath on 8/23. Reviewed cath instructions with patient. Patient advised to let us know if she develops any symptoms or if she has any questions prior to her procedure. Patient verbalized understanding and thanked me for the call.

## 2017-03-09 ENCOUNTER — Telehealth: Payer: Self-pay

## 2017-03-09 ENCOUNTER — Encounter (HOSPITAL_COMMUNITY): Payer: Self-pay | Admitting: *Deleted

## 2017-03-09 NOTE — Telephone Encounter (Signed)
Left message per DPR.    Patient contacted pre-catheterization at [facility] scheduled for: 03/10/2017 @ 0730 Verified arrival time and place:  NT @ 0530 Confirmed AM meds to be taken pre-cath with sip of water: Take ASA Notified patient must have responsible person to drive home post procedure and observe patient for 24 hours. Addl concerns:  Left this nurse name and # if any further questions.

## 2017-03-10 ENCOUNTER — Ambulatory Visit (HOSPITAL_COMMUNITY)
Admission: RE | Admit: 2017-03-10 | Discharge: 2017-03-10 | Disposition: A | Discharge status: Home / Self Care | Payer: BLUE CROSS/BLUE SHIELD | Source: Ambulatory Visit | Attending: Interventional Cardiology | Admitting: Interventional Cardiology

## 2017-03-10 ENCOUNTER — Encounter (HOSPITAL_COMMUNITY): Payer: Self-pay | Admitting: *Deleted

## 2017-03-10 ENCOUNTER — Encounter (HOSPITAL_COMMUNITY)
Admission: RE | Disposition: A | Discharge status: Home / Self Care | Payer: Self-pay | Source: Ambulatory Visit | Attending: Interventional Cardiology

## 2017-03-10 DIAGNOSIS — I209 Angina pectoris, unspecified: Secondary | ICD-10-CM

## 2017-03-10 DIAGNOSIS — E785 Hyperlipidemia, unspecified: Secondary | ICD-10-CM | POA: Insufficient documentation

## 2017-03-10 DIAGNOSIS — Z79899 Other long term (current) drug therapy: Secondary | ICD-10-CM | POA: Diagnosis not present

## 2017-03-10 DIAGNOSIS — E039 Hypothyroidism, unspecified: Secondary | ICD-10-CM | POA: Diagnosis not present

## 2017-03-10 DIAGNOSIS — R9439 Abnormal result of other cardiovascular function study: Secondary | ICD-10-CM

## 2017-03-10 DIAGNOSIS — M797 Fibromyalgia: Secondary | ICD-10-CM | POA: Insufficient documentation

## 2017-03-10 HISTORY — PX: LEFT HEART CATH AND CORONARY ANGIOGRAPHY: CATH118249

## 2017-03-10 SURGERY — LEFT HEART CATH AND CORONARY ANGIOGRAPHY
Anesthesia: LOCAL

## 2017-03-10 MED ORDER — HEPARIN SODIUM (PORCINE) 1000 UNIT/ML IJ SOLN
INTRAMUSCULAR | Status: AC
  Filled 2017-03-10: qty 1

## 2017-03-10 MED ORDER — SODIUM CHLORIDE 0.9 % WEIGHT BASED INFUSION: 1.0000 mL/kg/h | INTRAVENOUS | Status: DC

## 2017-03-10 MED ORDER — IOPAMIDOL (ISOVUE-370) INJECTION 76%
INTRAVENOUS | Status: DC | PRN
  Administered 2017-03-10: 40 mL via INTRA_ARTERIAL

## 2017-03-10 MED ORDER — HEPARIN (PORCINE) IN NACL 2-0.9 UNIT/ML-% IJ SOLN
INTRAMUSCULAR | Status: AC
  Filled 2017-03-10: qty 1000

## 2017-03-10 MED ORDER — SODIUM CHLORIDE 0.9% FLUSH: 3.0000 mL | INTRAVENOUS | Status: DC | PRN

## 2017-03-10 MED ORDER — SODIUM CHLORIDE 0.9% FLUSH
3.0000 mL | INTRAVENOUS | Status: DC | PRN
Start: 2017-03-10 — End: 2017-03-10

## 2017-03-10 MED ORDER — VERAPAMIL HCL 2.5 MG/ML IV SOLN
INTRAVENOUS | Status: DC | PRN
  Administered 2017-03-10: 10 mL via INTRA_ARTERIAL

## 2017-03-10 MED ORDER — FENTANYL CITRATE (PF) 100 MCG/2ML IJ SOLN
INTRAMUSCULAR | Status: DC | PRN
  Administered 2017-03-10: 25 ug via INTRAVENOUS

## 2017-03-10 MED ORDER — SODIUM CHLORIDE 0.9 % IV SOLN: INTRAVENOUS | Status: DC

## 2017-03-10 MED ORDER — LIDOCAINE HCL (PF) 1 % IJ SOLN
INTRAMUSCULAR | Status: DC | PRN
  Administered 2017-03-10: 2 mL via SUBCUTANEOUS

## 2017-03-10 MED ORDER — FENTANYL CITRATE (PF) 100 MCG/2ML IJ SOLN
INTRAMUSCULAR | Status: AC
  Filled 2017-03-10: qty 2

## 2017-03-10 MED ORDER — SODIUM CHLORIDE 0.9 % WEIGHT BASED INFUSION
3.0000 mL/kg/h | INTRAVENOUS | Status: AC
  Administered 2017-03-10: 3 mL/kg/h via INTRAVENOUS

## 2017-03-10 MED ORDER — SODIUM CHLORIDE 0.9% FLUSH: 3.0000 mL | Freq: Two times a day (BID) | INTRAVENOUS | Status: DC

## 2017-03-10 MED ORDER — MIDAZOLAM HCL 2 MG/2ML IJ SOLN
INTRAMUSCULAR | Status: AC
  Filled 2017-03-10: qty 2

## 2017-03-10 MED ORDER — LIDOCAINE HCL (PF) 1 % IJ SOLN
INTRAMUSCULAR | Status: AC
  Filled 2017-03-10: qty 30

## 2017-03-10 MED ORDER — MIDAZOLAM HCL 2 MG/2ML IJ SOLN
INTRAMUSCULAR | Status: DC | PRN
  Administered 2017-03-10: 2 mg via INTRAVENOUS

## 2017-03-10 MED ORDER — ASPIRIN 81 MG PO CHEW: 81.0000 mg | CHEWABLE_TABLET | ORAL | Status: DC

## 2017-03-10 MED ORDER — HEPARIN (PORCINE) IN NACL 2-0.9 UNIT/ML-% IJ SOLN
INTRAMUSCULAR | Status: AC | PRN
  Administered 2017-03-10: 1000 mL

## 2017-03-10 MED ORDER — HEPARIN SODIUM (PORCINE) 1000 UNIT/ML IJ SOLN
INTRAMUSCULAR | Status: DC | PRN
  Administered 2017-03-10: 4000 [IU] via INTRAVENOUS

## 2017-03-10 MED ORDER — SODIUM CHLORIDE 0.9 % IV SOLN: 250.0000 mL | INTRAVENOUS | Status: DC | PRN

## 2017-03-10 MED ORDER — ISOSORBIDE MONONITRATE ER 30 MG PO TB24
30.0000 mg | ORAL_TABLET | Freq: Every day | ORAL | 12 refills | Status: DC
Start: 2017-03-10 — End: 2017-03-17

## 2017-03-10 MED ORDER — VERAPAMIL HCL 2.5 MG/ML IV SOLN
INTRAVENOUS | Status: AC
  Filled 2017-03-10: qty 2

## 2017-03-10 SURGICAL SUPPLY — 9 items
CATH 5FR JL3.5 JR4 ANG PIG MP (CATHETERS) ×2 IMPLANT
DEVICE RAD COMP TR BAND LRG (VASCULAR PRODUCTS) ×2 IMPLANT
GLIDESHEATH SLEND SS 6F .021 (SHEATH) ×2 IMPLANT
GUIDEWIRE INQWIRE 1.5J.035X260 (WIRE) ×1 IMPLANT
INQWIRE 1.5J .035X260CM (WIRE) ×2
KIT HEART LEFT (KITS) ×2 IMPLANT
PACK CARDIAC CATHETERIZATION (CUSTOM PROCEDURE TRAY) ×2 IMPLANT
TRANSDUCER W/STOPCOCK (MISCELLANEOUS) ×2 IMPLANT
TUBING CIL FLEX 10 FLL-RA (TUBING) ×2 IMPLANT

## 2017-03-10 NOTE — H&P (View-Only) (Signed)
      Patient Care Team: Kim, Hannah R, DO as PCP - General (Family Medicine)   HPI  Brittany Moran is a 61 y.o. female Seen as an add-on today following an abnormal standard treadmill test.  She is a 61-year-old vegan and who has a rather chronic history of stable exertional chest discomfort. It is provoked with rapid onset of exercise as well as associated with sexual intercourse. It abates with slowing and gradual resumption. A little bit of shortness of breath. The discomfort radiates up into her neck and not further.  She has a history of palpitations for which she was treated with atenolol. A few years ago. This has not been much of her problem.  Family history is notable for coronary disease in her father and her brothers have had valve surgery.  Records and Results Reviewed As Below   Past Medical History:  Diagnosis Date  . Cardiac arrhythmia   . Chicken pox   . DDD (degenerative disc disease)   . Dry eyes 09/10/2013  . Fibromyalgia 09/10/2013  . Graves disease   . Hypothyroidism, acquired 09/10/2013  . Palpitations 09/10/2013  . Polyarthralgia   . Thyroid disease   . UTI (urinary tract infection)     Past Surgical History:  Procedure Laterality Date  . WISDOM TOOTH EXTRACTION      Current Outpatient Prescriptions  Medication Sig Dispense Refill  . CALCIUM PO Take 1 tablet by mouth daily.     . Cholecalciferol (VITAMIN D-3 PO) Take 1 capsule by mouth daily.    . Cyanocobalamin (B-12 PO) Take 1 tablet by mouth daily.     . metoprolol succinate (TOPROL-XL) 50 MG 24 hr tablet Take 1 tablet (50 mg total) by mouth daily. Take with or immediately following a meal. 90 tablet 3  . SYNTHROID 125 MCG tablet TAKE 1 TABLET BY MOUTH DAILY BEFORE BREAKFAST 90 tablet 1   No current facility-administered medications for this visit.     No Known Allergies    Review of Systems negative except from HPI and PMH  Physical Exam BP (!) 150/94   Pulse 68  Well developed  and well nourished in no acute distress HENT normal E scleral and icterus clear Neck Supple JVP flat; carotids brisk and full Clear to ausculation Regular rate and rhythm, no murmurs gallops or rub Soft with active bowel sounds No clubbing cyanosis  Edema Alert and oriented, grossly normal motor and sensory function Skin Warm and Dry  Treadmill was personally reviewed and demonstrated to 3 mm of flat ST segment depression in inferolateral leads. Resolved by 2 minutes of recovery  Assessment and  Plan  Exertional chest pain  Abnormal treadmill test  Hyperlipidemia  Abnormal treadmill testing with her rather typical discomfort these are moderately high probability for coronary disease. I discussed options with her as well as Dr. C Mac.  We discussed further noninvasive testing i.e. CT angiography versus catheterization. We've elected to pursue the latter. We discussed risks and benefits.  We will begin her on aspirin as well as low-dose beta blockers. Side effects were reviewed.  Will be arranged with Dr. JV who returns next week. Current medicines are reviewed at length with the patient today .  The patient does not  have concerns regarding medicines.  

## 2017-03-10 NOTE — Interval H&P Note (Signed)
Cath Lab Visit (complete for each Cath Lab visit)  Clinical Evaluation Leading to the Procedure:   ACS: No.  Non-ACS:    Anginal Classification: CCS III  Anti-ischemic medical therapy: Maximal Therapy (2 or more classes of medications)  Non-Invasive Test Results: Intermediate-risk stress test findings: cardiac mortality 1-3%/year  Prior CABG: No previous CABG   Exertional chest discomfort with abnormal ETT.   History and Physical Interval Note:  03/10/2017 7:30 AM  Brittany Moran  has presented today for surgery, with the diagnosis of cp - abnormal stress test  The various methods of treatment have been discussed with the patient and family. After consideration of risks, benefits and other options for treatment, the patient has consented to  Procedure(s): LEFT HEART CATH AND CORONARY ANGIOGRAPHY (N/A) as a surgical intervention .  The patient's history has been reviewed, patient examined, no change in status, stable for surgery.  I have reviewed the patient's chart and labs.  Questions were answered to the patient's satisfaction.     Lance Muss

## 2017-03-10 NOTE — Discharge Instructions (Signed)

## 2017-03-14 ENCOUNTER — Telehealth: Payer: Self-pay | Admitting: Interventional Cardiology

## 2017-03-14 NOTE — Telephone Encounter (Signed)
OK for half dose.

## 2017-03-14 NOTE — Telephone Encounter (Signed)
Patient states that she has had severe HAs since starting the imdur. She states that she has tried taking it at night and it has not helped. She states that she has taken ibuprofen and Excedrin migraine and it has not helped either. She states that it was to the point that she could not function. She states that she started taking half (15 mg) and the HA is not as bad. Patient was calling to let us know that she plans on taking a half a tablet for now and will increase it after her body gets used to it.

## 2017-03-14 NOTE — Telephone Encounter (Signed)
New Message    Pt c/o medication issue:  1. Name of Medication:  isosorbide mononitrate (IMDUR) 30 MG 24 hr tablet Take 1 tablet (30 mg total) by mouth daily.     2. How are you currently taking this medication (dosage and times per day)? 1 tablet   3. Are you having a reaction (difficulty breathing--STAT)? Yes   4. What is your medication issue? Severe headache , where she can not function, she tried cutting it in half and only taking half the pill to ease into the medication but it caused the same thing

## 2017-03-15 NOTE — Telephone Encounter (Signed)
Left detailed message on VM (DPR on file).

## 2017-03-16 ENCOUNTER — Telehealth: Payer: Self-pay | Admitting: Interventional Cardiology

## 2017-03-16 NOTE — Telephone Encounter (Signed)
Brittany Moran is calling because she is on Imdur and she is still having the headaches even after taking a half of pill. She is wanting to know if there is an alternative in which she can take . Please call

## 2017-03-16 NOTE — Telephone Encounter (Signed)
Patient states that she is still having severe Has even though she has decreased the imdur to 15 mg QD and despite taking Tylenol. Patient states that before all of this that her symptoms were only associated with increased HR and strenuous activity (ie. Exercising) and wants to know if she can be prescribed something different or take something prn. Made patient aware that the information would be forwarded to Dr. Eldridge DaceVaranasi for review and recommendation. Patient verbalized understanding.

## 2017-03-16 NOTE — Telephone Encounter (Signed)
Left message for patient to call back  

## 2017-03-17 ENCOUNTER — Encounter: Payer: Self-pay | Admitting: Interventional Cardiology

## 2017-03-17 MED ORDER — ISOSORBIDE MONONITRATE ER 30 MG PO TB24: 15.0000 mg | ORAL_TABLET | ORAL | 11 refills | Status: DC | PRN

## 2017-03-17 NOTE — Addendum Note (Signed)
Addended by: Daleen BoURRIE, Ainara Eldridge I on: 03/17/2017 03:25 PM   Modules accepted: Orders

## 2017-03-17 NOTE — Telephone Encounter (Signed)
Patient made aware of Dr. Hoyle BarrVaranasi's recommendations to try imdur 15 mg prn before exercise. Patient verbalized understanding.

## 2017-03-17 NOTE — Telephone Encounter (Signed)
This encounter was created in error - please disregard.

## 2017-03-17 NOTE — Telephone Encounter (Signed)
Left message for patient to call back  

## 2017-03-17 NOTE — Telephone Encounter (Signed)
Follow Up:      Returning your call from today. 

## 2017-03-17 NOTE — Telephone Encounter (Signed)
She can try to use Imdur 15 mg prn before exercise.

## 2017-03-23 ENCOUNTER — Telehealth: Payer: Self-pay | Admitting: Interventional Cardiology

## 2017-03-23 NOTE — Telephone Encounter (Signed)
New message      Pt had a cath on 03-10-17.  She states that the site at her wrist is still bruised, tender and at times the muscle at the site cramps.  Is this normal?

## 2017-03-23 NOTE — Telephone Encounter (Signed)
Patient calling and states that she had a cath done on 03/10/17 and states that she still has bruising and intermittent muscle cramps in her right wrist when she moves it a certain way. Patient states that she stretches and moves her wrist around and the cramping goes away. Patient denies having any swelling, hematoma, pain, bleeding, or redness at the site. Patient denies any  numbness, tingling, or temperature changes. Patient states that she can feel a pulse on that side. Patient made aware that bruising can be present for a couple of weeks and should start to fade. Patient advised to continue to monitor and let us know if her symptoms worsen. Patient verbalized understanding and thanked me for the call.

## 2017-04-07 ENCOUNTER — Encounter: Payer: Self-pay | Admitting: Family Medicine

## 2017-05-10 ENCOUNTER — Ambulatory Visit (INDEPENDENT_AMBULATORY_CARE_PROVIDER_SITE_OTHER): Payer: BLUE CROSS/BLUE SHIELD | Admitting: Interventional Cardiology

## 2017-05-10 ENCOUNTER — Other Ambulatory Visit: Payer: Self-pay | Admitting: Internal Medicine

## 2017-05-10 ENCOUNTER — Encounter: Payer: Self-pay | Admitting: Interventional Cardiology

## 2017-05-10 VITALS — BP 118/62 | HR 61 | Ht 68.5 in | Wt 181.8 lb

## 2017-05-10 DIAGNOSIS — I209 Angina pectoris, unspecified: Secondary | ICD-10-CM | POA: Diagnosis not present

## 2017-05-10 DIAGNOSIS — Z8249 Family history of ischemic heart disease and other diseases of the circulatory system: Secondary | ICD-10-CM | POA: Diagnosis not present

## 2017-05-10 MED ORDER — NITROGLYCERIN 0.4 MG SL SUBL: 0.4000 mg | SUBLINGUAL_TABLET | SUBLINGUAL | 3 refills | Status: AC | PRN

## 2017-05-10 NOTE — Progress Notes (Signed)
Cardiology Office Note   Date:  05/10/2017   ID:  Brittany Moran, DOB May 29, 1956, MRN [id number]  PCP:  Terressa KoyanagiKim, Hannah R, DO    No chief complaint on file. angina   Wt Readings from Last 3 Encounters:  05/10/17 181 lb 12.8 oz (82.5 kg)  03/10/17 170 lb (77.1 kg)  01/31/17 180 lb 6.4 oz (81.8 kg)       History of Present Illness: Brittany CanterburyJeanette Morfin is a 61 y.o. female  Who had sx of angina and then had a cath.  THere was some vasospasm on the cath, but no significant CAD.    The left ventricular systolic function is normal.  LV end diastolic pressure is mildly elevated, 18 mm Hg.  The left ventricular ejection fraction is 55-65% by visual estimate.  There is no aortic valve stenosis.  Mild catheter induced spasm of the proximal RCA with catheter engagement.   No angiographically apparent coronary artery disease.      She continues to have chest discomfort with exertion for the first 60 seconds of the activity, but then it resolves.  She tried Imdur, but could not tolerate it due to headaches.       Past Medical History:  Diagnosis Date  . Cardiac arrhythmia   . Chicken pox   . DDD (degenerative disc disease)   . Dry eyes 09/10/2013  . Fibromyalgia 09/10/2013  . Graves disease   . Hypothyroidism, acquired 09/10/2013  . Palpitations 09/10/2013  . Polyarthralgia   . Thyroid disease   . UTI (urinary tract infection)     Past Surgical History:  Procedure Laterality Date  . LEFT HEART CATH AND CORONARY ANGIOGRAPHY N/A 03/10/2017   Procedure: LEFT HEART CATH AND CORONARY ANGIOGRAPHY;  Surgeon: Corky CraftsVaranasi, Karlea Mckibbin S, MD;  [facility];  Service: Cardiovascular;  Laterality: N/A;  . WISDOM TOOTH EXTRACTION       Current Outpatient Prescriptions  Medication Sig Dispense Refill  . aspirin EC 81 MG tablet Take 81 mg by mouth daily.    Marland Kitchen. CALCIUM PO Take 1 tablet by mouth daily.     . Cholecalciferol (VITAMIN D-3) 1000 units CAPS Take 1 capsule by  mouth daily.    . Misc Natural Products (GLUCOSAMINE CHOND DOUBLE STR PO) Take 2 tablets by mouth daily.    Marland Kitchen. SYNTHROID 125 MCG tablet TAKE 1 TABLET BY MOUTH DAILY BEFORE BREAKFAST 90 tablet 1  . vitamin B-12 (CYANOCOBALAMIN) 1000 MCG tablet Take 1,000 mcg by mouth daily.     No current facility-administered medications for this visit.     Allergies:   Patient has no known allergies.    Social History:  The patient  reports that she has never smoked. She has never used smokeless tobacco. She reports that she drinks about 0.6 - 1.2 oz of alcohol per week . She reports that she does not use drugs.   Family History:  The patient's family history includes Arthritis in her mother; Bipolar disorder in her father; Depression in her unknown relative; Diabetes in her mother; Heart disease in her father; Hyperlipidemia in her father and mother; Hypertension in her father and mother; Schizophrenia in her brother; Sudden death in her maternal grandmother and paternal grandfather; Tuberculosis in her maternal grandmother.    ROS:  Please see the history of present illness.   Otherwise, review of systems are positive for chest discomfort.   All other systems are reviewed and negative.    PHYSICAL EXAM: VS:  BP  118/62   Pulse 61   Ht 5' 8.5" (1.74 m)   Wt 181 lb 12.8 oz (82.5 kg)   SpO2 98%   BMI 27.24 kg/m  , BMI Body mass index is 27.24 kg/m. GEN: Well nourished, well developed, in no acute distress  HEENT: normal  Neck: no JVD, carotid bruits, or masses Cardiac: RRR; no murmurs, rubs, or gallops,no edema  Respiratory:  clear to auscultation bilaterally, normal work of breathing GI: soft, nontender, nondistended, + BS MS: no deformity or atrophy ; 2+ right radial pulse Skin: warm and dry, no rash Neuro:  Strength and sensation are intact Psych: euthymic mood, full affect    Recent Labs: 12/16/2016: TSH 1.24 02/25/2017: BUN 9; Creatinine, Ser 0.82; Hemoglobin 13.8; Platelets 273; Potassium  3.9; Sodium 142   Lipid Panel    Component Value Date/Time   CHOL 226 (H) 12/16/2016 0752   TRIG 104.0 12/16/2016 0752   HDL 71.80 12/16/2016 0752   CHOLHDL 3 12/16/2016 0752   VLDL 20.8 12/16/2016 0752   LDLCALC 133 (H) 12/16/2016 0752     Other studies Reviewed: Additional studies/ records that were reviewed today with results demonstrating: cath results.   ASSESSMENT AND PLAN:  1. Angina: prescribe SL NTG.  She can pretreat before activity.  If she has headaches, she may just opt to deal with the 60 seconds of discomfort at the start of an activity.  I asked her to make sure she did a 3-4 minute warmup phase before strenuous exercise.  This may help avoid her sx as well.  2. Family h/o CAD: Continue preventive therapy.    Current medicines are reviewed at length with the patient today.  The patient concerns regarding her medicines were addressed.  The following changes have been made:  Rx for Sl NTG  Labs/ tests ordered today include:  No orders of the defined types were placed in this encounter.   Recommend 150 minutes/week of aerobic exercise Low fat, low carb, high fiber diet recommended  Disposition:   FU in 1 year   Signed, Lance Muss, MD  05/10/2017 4:08 PM    [facility] [address] [place], [place], [place]  [address] Phone: [phone]; Fax: [phone]

## 2017-05-10 NOTE — Patient Instructions (Signed)
Medication Instructions:  Your physician has recommended you make the following change in your medication:   A prescription for Sublingual Nitroglycerin 0.4 mg was sent in to your pharmacy. You may use this AS NEEDED prior to exercise to help with chest pain.    Labwork: None ordered  Testing/Procedures: None ordered  Follow-Up: Your physician wants you to follow-up AS NEEDED   Any Other Special Instructions Will Be Listed Below (If Applicable).     If you need a refill on your cardiac medications before your next appointment, please call your pharmacy.

## 2017-07-13 ENCOUNTER — Ambulatory Visit (INDEPENDENT_AMBULATORY_CARE_PROVIDER_SITE_OTHER): Payer: BLUE CROSS/BLUE SHIELD | Admitting: Obstetrics and Gynecology

## 2017-07-13 ENCOUNTER — Other Ambulatory Visit: Payer: Self-pay

## 2017-07-13 ENCOUNTER — Encounter: Payer: Self-pay | Admitting: Obstetrics and Gynecology

## 2017-07-13 VITALS — BP 124/82 | HR 60 | Resp 12 | Ht 68.5 in | Wt 184.4 lb

## 2017-07-13 DIAGNOSIS — N952 Postmenopausal atrophic vaginitis: Secondary | ICD-10-CM

## 2017-07-13 DIAGNOSIS — N941 Unspecified dyspareunia: Secondary | ICD-10-CM | POA: Diagnosis not present

## 2017-07-13 DIAGNOSIS — N763 Subacute and chronic vulvitis: Secondary | ICD-10-CM

## 2017-07-13 DIAGNOSIS — Z01419 Encounter for gynecological examination (general) (routine) without abnormal findings: Secondary | ICD-10-CM

## 2017-07-13 MED ORDER — BETAMETHASONE VALERATE 0.1 % EX OINT: TOPICAL_OINTMENT | CUTANEOUS | 0 refills | Status: DC

## 2017-07-13 MED ORDER — ESTRADIOL 10 MCG VA TABS: ORAL_TABLET | VAGINAL | 3 refills | Status: DC

## 2017-07-13 NOTE — Progress Notes (Signed)
61 y.o. [id number] MarriedCaucasianF here for annual exam.  She has had 2-3 UTI's in the last year. She tends to get them after intercourse. She never filled the vagifem script last year secondary to expence. Has discomfort with intercourse, lubrication helps some, not enough. Uncomfortable afterwards.     No LMP recorded. Patient is postmenopausal.          Sexually active: Yes.    The current method of family planning is post menopausal status.    Exercising: Yes.    walking Smoker:  no  Health Maintenance: Pap:  01/01/16 negative, HR HPV negative  History of abnormal Pap:  yes MMG:  12/23/15 BIRADS 1 negative  Colonoscopy:  12/18/07 normal  BMD:   never TDaP:  05/19/13  Gardasil: n/a   reports that  has never smoked. she has never used smokeless tobacco. She reports that she drinks about 0.6 - 1.2 oz of alcohol per week. She reports that she does not use drugs. She teaches English as a second language, full time. She has 2 daughters, [place] in [place] one in [place]. Older one has 4 children. 3.5 hour drive.   Past Medical History:  Diagnosis Date  . Cardiac arrhythmia   . Chicken pox   . DDD (degenerative disc disease)   . Dry eyes 09/10/2013  . Fibromyalgia 09/10/2013  . Graves disease   . Hypothyroidism, acquired 09/10/2013  . Palpitations 09/10/2013  . Polyarthralgia   . Thyroid disease   . UTI (urinary tract infection)     Past Surgical History:  Procedure Laterality Date  . LEFT HEART CATH AND CORONARY ANGIOGRAPHY N/A 03/10/2017   Procedure: LEFT HEART CATH AND CORONARY ANGIOGRAPHY;  Surgeon: Corky CraftsVaranasi, Jayadeep S, MD;  [facility];  Service: Cardiovascular;  Laterality: N/A;  . WISDOM TOOTH EXTRACTION    Normal. She gets some chest pain and was told she had vessel spasms. Has nitroglycerine to take if needed.   Current Outpatient Medications  Medication Sig Dispense Refill  . aspirin EC 81 MG tablet Take 81 mg by mouth daily.    Marland Kitchen. CALCIUM PO Take 1 tablet  by mouth daily.     . Cholecalciferol (VITAMIN D-3) 1000 units CAPS Take 1 capsule by mouth daily.    . Misc Natural Products (GLUCOSAMINE CHOND DOUBLE STR PO) Take 2 tablets by mouth daily.    . nitroGLYCERIN (NITROSTAT) 0.4 MG SL tablet Place 1 tablet (0.4 mg total) under the tongue as needed (before exercise for chest pain). 25 tablet 3  . SYNTHROID 125 MCG tablet TAKE 1 TABLET BY MOUTH DAILY BEFORE BREAKFAST 90 tablet 0  . vitamin B-12 (CYANOCOBALAMIN) 1000 MCG tablet Take 1,000 mcg by mouth daily.     No current facility-administered medications for this visit.     Family History  Problem Relation Age of Onset  . Arthritis Mother   . Hyperlipidemia Mother   . Hypertension Mother   . Diabetes Mother   . Heart disease Father   . Bipolar disorder Father   . Hypertension Father   . Hyperlipidemia Father   . Schizophrenia Brother   . Sudden death Maternal Grandmother   . Tuberculosis Maternal Grandmother   . Sudden death Paternal Grandfather   . Depression Unknown        brothers and sisters     Review of Systems  Constitutional: Negative.   HENT: Negative.   Eyes: Negative.   Respiratory: Negative.   Cardiovascular: Negative.   Gastrointestinal: Negative.  Genitourinary:       Pain with intercourse Vaginal/vulvar itching  Allergic/Immunologic: Negative.   Neurological: Negative.   Hematological: Negative.   Psychiatric/Behavioral: Negative.   Vaginal itching is intermittent, seems related to dry skin.   Exam:   BP 124/82 (BP Location: Right Arm, Patient Position: Sitting, Cuff Size: Normal)   Pulse 60   Resp 12   Ht 5' 8.5" (1.74 m)   Wt 184 lb 6.4 oz (83.6 kg)   BMI 27.63 kg/m   Weight change: @WEIGHTCHANGE @ Height:   Height: 5' 8.5" (174 cm)  Ht Readings from Last 3 Encounters:  07/13/17 5' 8.5" (1.74 m)  05/10/17 5' 8.5" (1.74 m)  03/10/17 5' 8.5" (1.74 m)    General appearance: alert, cooperative and appears stated age Head: Normocephalic, without  obvious abnormality, atraumatic Neck: no adenopathy, supple, symmetrical, trachea midline and thyroid normal to inspection and palpation Lungs: clear to auscultation bilaterally Cardiovascular: regular rate and rhythm Breasts: normal appearance, no masses or tenderness Abdomen: soft, non-tender; non distended,  no masses,  no organomegaly Extremities: extremities normal, atraumatic, no cyanosis or edema Skin: Skin color, texture, turgor normal. No rashes or lesions Lymph nodes: Cervical, supraclavicular, and axillary nodes normal. No abnormal inguinal nodes palpated Neurologic: Grossly normal   Pelvic: External genitalia: on the lateral right labia majora is an area of erythema, irritation.               Urethra:  normal appearing urethra with no masses, tenderness or lesions              Bartholins and Skenes: normal                 Vagina: normal appearing vagina with normal color and discharge, no lesions              Cervix: no lesions               Bimanual Exam:  Uterus:  not appreciably enlarged, not tender              Adnexa: no mass, fullness, tenderness               Rectovaginal: Confirms               Anus:  normal sphincter tone, no lesions  Chaperone was present for exam.  A:  Well Woman with normal exam  Vulvitis  Vaginal atrophy, dyspareunia and recurrent UTI's  P:   No pap this year  Mammogram due  Colonoscopy in 2019  Discussed breast self exam  Discussed calcium and vit D intake  Start vaginal estrogen (tablets called in, she will call if too expensive for an alternative)

## 2017-07-13 NOTE — Patient Instructions (Signed)

## 2017-07-14 LAB — VAGINITIS/VAGINOSIS, DNA PROBE
CANDIDA SPECIES: NEGATIVE
Gardnerella vaginalis: NEGATIVE
Trichomonas vaginosis: NEGATIVE

## 2017-07-28 ENCOUNTER — Encounter: Payer: Self-pay | Admitting: Family Medicine

## 2017-08-12 ENCOUNTER — Other Ambulatory Visit: Payer: Self-pay | Admitting: Internal Medicine

## 2017-08-15 NOTE — Telephone Encounter (Signed)
Patient need a PA on medication Synthroid called [facility].

## 2017-09-09 ENCOUNTER — Telehealth: Payer: Self-pay | Admitting: Obstetrics and Gynecology

## 2017-09-09 NOTE — Telephone Encounter (Signed)
Patient called requesting a couple of names of lower cost alternatives to estradiol. She said she has a high deductible insurance and estradiol will now cost her too much. Okay to leave details on her voice mail so she can check with her insurance about price options, per patient.

## 2017-09-09 NOTE — Telephone Encounter (Signed)
Spoke with patient. Advised she may check with insurance on cost of Yuvafem vaginal tablets, Premarin cream, and Estrace cream. Patient is agreeable and will return call with further information after speaking with insurance.  Routing to provider for final review. Patient agreeable to disposition. Will close encounter.

## 2017-09-14 ENCOUNTER — Telehealth: Payer: Self-pay | Admitting: Obstetrics and Gynecology

## 2017-09-14 NOTE — Telephone Encounter (Signed)
Left message to call [name] at [phone]. 

## 2017-09-14 NOTE — Telephone Encounter (Signed)
Patient would like to discuss sending medication to compound pharmacy. Last seen 07/13/17. Can call anytime from 12:10 - 1 or after 3:30.

## 2017-09-14 NOTE — Telephone Encounter (Signed)
Can you please call the compounding pharmacy and ask them to compound the equivalent dose to estrace cream, 1 gram vaginally 2 x a week. 3 month supply with 3 refills

## 2017-09-14 NOTE — Telephone Encounter (Signed)
Spoke with patient. Patient states that she spoke with her insurance company about all alternatives to Estradiol vaginal tablets and these are all too costly. Asking for further recommendations. Advised will review with Dr.Jertson compounded vaginal estrogen which will reduce cost. Patient is agreeable.  Dr.Jertson okay for Estradiol 0.02%  Insert ______ amount vaginally_____ times per week. Refills ____.

## 2017-09-19 MED ORDER — NONFORMULARY OR COMPOUNDED ITEM: 3 refills | Status: AC

## 2017-09-19 NOTE — Telephone Encounter (Signed)
Rx written and to Dr.Jertson for signature before faxing to [facility].

## 2017-09-19 NOTE — Telephone Encounter (Signed)
Left message at number provided, okay per ROI. Advised rx has been faxed to [facility] and she will be contacted once rx is ready for pick up.  Routing to provider for final review. Patient agreeable to disposition. Will close encounter.

## 2017-11-03 ENCOUNTER — Other Ambulatory Visit: Payer: Self-pay | Admitting: Internal Medicine

## 2017-12-08 ENCOUNTER — Ambulatory Visit (INDEPENDENT_AMBULATORY_CARE_PROVIDER_SITE_OTHER): Payer: BLUE CROSS/BLUE SHIELD | Admitting: Family Medicine

## 2017-12-08 ENCOUNTER — Encounter: Payer: Self-pay | Admitting: Family Medicine

## 2017-12-08 VITALS — BP 118/80 | HR 56 | Temp 98.2°F | Ht 67.75 in | Wt 175.0 lb

## 2017-12-08 DIAGNOSIS — Z1331 Encounter for screening for depression: Secondary | ICD-10-CM

## 2017-12-08 DIAGNOSIS — E039 Hypothyroidism, unspecified: Secondary | ICD-10-CM | POA: Diagnosis not present

## 2017-12-08 DIAGNOSIS — Z0000 Encounter for general adult medical examination without abnormal findings: Secondary | ICD-10-CM

## 2017-12-08 NOTE — Progress Notes (Signed)
HPI:  Using dictation device. Unfortunately this device frequently misinterprets words/phrases.  Here for CPE:  -Concerns and/or follow up today:none  Saw cardiology for the abnormal stress test. Reports told is coronary spasm. Tried imdur but gave her headaches so now take nitroglycerin as needed prior to bike riding. Otherwise, well without concerns. Seeing endo soon and requests we add thyroid check to her labs.  -Diet: variety of foods, balance and well rounded, larger portion sizes -Exercise: no regular exercise -Taking folic acid, vitamin D or calcium: no -Diabetes and Dyslipidemia Screening: not fasting -Vaccines: see vaccine section Epic - declined shingles vaccine, wants to check with insurance on coverage -pap history: reports UTD, sees gyn -FDLMP: see nursing notes -sexual activity: yes, female partner, no new partners -wants STI testing (Hep C if born 35-65): no -FH breast, colon or ovarian ca: see FH Last mammogram: reports UTD, sees gyn Last colon cancer screening: reports UTd Breast Ca Risk Assessment: see family history and pt history DEXA (>/= 93): n/a  -Alcohol, Tobacco, drug use: see social history  Review of Systems - no fevers, unintentional weight loss, vision loss, hearing loss, chest pain, sob, hemoptysis, melena, hematochezia, hematuria, genital discharge, changing or concerning skin lesions, bleeding, bruising, loc, thoughts of self harm or SI  Past Medical History:  Diagnosis Date  . Cardiac arrhythmia   . Chicken pox   . DDD (degenerative disc disease)   . Dry eyes 09/10/2013  . Fibromyalgia 09/10/2013  . Graves disease   . Hypothyroidism, acquired 09/10/2013  . Palpitations 09/10/2013  . Polyarthralgia   . Thyroid disease   . UTI (urinary tract infection)     Past Surgical History:  Procedure Laterality Date  . LEFT HEART CATH AND CORONARY ANGIOGRAPHY N/A 03/10/2017   Procedure: LEFT HEART CATH AND CORONARY ANGIOGRAPHY;  Surgeon: Corky Crafts, MD;  Location: [facility] INVASIVE CV LAB;  Service: Cardiovascular;  Laterality: N/A;  . WISDOM TOOTH EXTRACTION      Family History  Problem Relation Age of Onset  . Arthritis Mother   . Hyperlipidemia Mother   . Hypertension Mother   . Diabetes Mother   . Heart disease Father   . Bipolar disorder Father   . Hypertension Father   . Hyperlipidemia Father   . Schizophrenia Brother   . Sudden death Maternal Grandmother   . Tuberculosis Maternal Grandmother   . Sudden death Paternal Grandfather   . Depression Unknown        brothers and sisters     Social History   Socioeconomic History  . Marital status: Married    Spouse name: Not on file  . Number of children: Not on file  . Years of education: Not on file  . Highest education level: Not on file  Occupational History  . Not on file  Social Needs  . Financial resource strain: Not on file  . Food insecurity:    Worry: Not on file    Inability: Not on file  . Transportation needs:    Medical: Not on file    Non-medical: Not on file  Tobacco Use  . Smoking status: Never Smoker  . Smokeless tobacco: Never Used  Substance and Sexual Activity  . Alcohol use: Yes    Alcohol/week: 0.6 - 1.2 oz    Types: 1 - 2 Standard drinks or equivalent per week    Comment: socially   . Drug use: No  . Sexual activity: Yes    Partners: Male  Lifestyle  .  Physical activity:    Days per week: Not on file    Minutes per session: Not on file  . Stress: Not on file  Relationships  . Social connections:    Talks on phone: Not on file    Gets together: Not on file    Attends religious service: Not on file    Active member of club or organization: Not on file    Attends meetings of clubs or organizations: Not on file    Relationship status: Not on file  Other Topics Concern  . 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  betamethasone valerate ointment (VALISONE) 0.1 %, Apply a pea sized amount topically BID for 1-2 weeks as needed. Not for long term daily use, Disp: 15 g, Rfl: 0 .  CALCIUM PO, Take 1 tablet by mouth daily. , Disp: , Rfl:  .  Cholecalciferol (VITAMIN D-3) 1000 units CAPS, Take 1 capsule by mouth daily., Disp: , Rfl:  .  Misc Natural Products (GLUCOSAMINE CHOND DOUBLE STR PO), Take 2 tablets by mouth daily., Disp: , Rfl:  .  NONFORMULARY OR COMPOUNDED ITEM, Estrace cream 0.02% insert 1 gram vaginally 2 x a week. 3 month supply 3 refills., Disp: 3 each, Rfl: 3 .  SYNTHROID 125 MCG tablet, TAKE 1 TABLET BY MOUTH DAILY BEFORE BREAKFAST, Disp: 90 tablet, Rfl: 0 .  vitamin B-12 (CYANOCOBALAMIN) 1000 MCG tablet, Take 1,000 mcg by mouth daily., Disp: , Rfl:  .  nitroGLYCERIN (NITROSTAT) 0.4 MG SL tablet, Place 1 tablet (0.4 mg total) under the tongue as needed (before exercise for chest pain)., Disp: 25 tablet, Rfl: 3  EXAM:  Vitals:   12/08/17 1551  BP: 118/80  Pulse: (!) 56  Temp: 98.2 F (36.8 C)    GENERAL: vitals reviewed and listed below, alert, oriented, appears well hydrated and in no acute distress  HEENT: head atraumatic, PERRLA, normal appearance of eyes, ears, nose and mouth. moist mucus membranes.  NECK: supple, no masses or lymphadenopathy  LUNGS: clear to auscultation bilaterally, no rales, rhonchi or wheeze  CV: HRRR, no peripheral edema or cyanosis, normal pedal pulses  ABDOMEN: bowel sounds normal, soft, non tender to palpation, no masses, no rebound or guarding  GU/BREAST: sees gyn  SKIN: no rash or abnormal lesions, declined  exam excep on back - no abnormal lesions  MS: normal gait, moves all extremities normally  NEURO: normal gait, speech and thought processing grossly intact, muscle tone grossly intact throughout  PSYCH: normal affect, pleasant and cooperative  ASSESSMENT AND PLAN:  Discussed the following assessment and plan:  PREVENTIVE EXAM: -Discussed and advised all [place] preventive services health task force level A and B recommendations for age, sex and risks. -Advised at least 150 minutes of exercise per week and a healthy diet with avoidance of (  less then 1 serving per week) processed foods, white starches, red meat, fast foods and sweets and consisting of: * 5-9 servings of fresh fruits and vegetables (not corn or potatoes) *nuts and seeds, beans *olives and olive oil *lean meats such as fish and white chicken  *whole grains -labs, studies and vaccines per orders this encounter - Hemoglobin A1c - Lipid panel  2. Screening for depression -neg  3. Hypothyroidism, acquired - TSH -sees endo for managment  Patient advised to return to clinic immediately if symptoms worsen or persist or new concerns.  There are no Patient Instructions on file for this visit.  No follow-ups on file.  Terressa Koyanagi, DO

## 2017-12-08 NOTE — Patient Instructions (Signed)
BEFORE YOU LEAVE: -yearly for physical  Ask your insurance company about coverage for the shingles vaccine.  We have ordered labs or studies at this visit. It can take up to 1-2 weeks for results and processing. IF results require follow up or explanation, we will call you with instructions. Clinically stable results will be released to your [facility]. If you have not heard from [place] or cannot find your results in [facility] in 2 weeks please contact our office at [phone].  If you are not yet signed up for [facility], please consider signing up.   Preventive Care 40-64 Years, Female Preventive care refers to lifestyle choices and visits with your health care provider that can promote health and wellness. What does preventive care include?  A yearly physical exam. This is also called an annual well check.  Dental exams once or twice a year.  Routine eye exams. Ask your health care provider how often you should have your eyes checked.  Personal lifestyle choices, including: ? Daily care of your teeth and gums. ? Regular physical activity. ? Eating a healthy diet. ? Avoiding tobacco and drug use. ? Limiting alcohol use. ? Practicing safe sex. ? Taking low-dose aspirin daily starting at age 58. ? Taking vitamin and mineral supplements as recommended by your health care provider. What happens during an annual well check? The services and screenings done by your health care provider during your annual well check will depend on your age, overall health, lifestyle risk factors, and family history of disease. Counseling Your health care provider may ask you questions about your:  Alcohol use.  Tobacco use.  Drug use.  Emotional well-being.  Home and relationship well-being.  Sexual activity.  Eating habits.  Work and work Statistician.  Method of birth control.  Menstrual cycle.  Pregnancy history.  Screening You may have the following tests or measurements:  Height,  weight, and BMI.  Blood pressure.  Lipid and cholesterol levels. These may be checked every 5 years, or more frequently if you are over 55 years old.  Skin check.  Lung cancer screening. You may have this screening every year starting at age 85 if you have a 30-pack-year history of smoking and currently smoke or have quit within the past 15 years.  Fecal occult blood test (FOBT) of the stool. You may have this test every year starting at age 5.  Flexible sigmoidoscopy or colonoscopy. You may have a sigmoidoscopy every 5 years or a colonoscopy every 10 years starting at age 20.  Hepatitis C blood test.  Hepatitis B blood test.  Sexually transmitted disease (STD) testing.  Diabetes screening. This is done by checking your blood sugar (glucose) after you have not eaten for a while (fasting). You may have this done every 1-3 years.  Mammogram. This may be done every 1-2 years. Talk to your health care provider about when you should start having regular mammograms. This may depend on whether you have a family history of breast cancer.  BRCA-related cancer screening. This may be done if you have a family history of breast, ovarian, tubal, or peritoneal cancers.  Pelvic exam and Pap test. This may be done every 3 years starting at age 46. Starting at age 66, this may be done every 5 years if you have a Pap test in combination with an HPV test.  Bone density scan. This is done to screen for osteoporosis. You may have this scan if you are at high risk for osteoporosis.  Discuss  your test results, treatment options, and if necessary, the need for more tests with your health care provider. Vaccines Your health care provider may recommend certain vaccines, such as:  Influenza vaccine. This is recommended every year.  Tetanus, diphtheria, and acellular pertussis (Tdap, Td) vaccine. You may need a Td booster every 10 years.  Varicella vaccine. You may need this if you have not been  vaccinated.  Zoster vaccine. You may need this after age 37.  Measles, mumps, and rubella (MMR) vaccine. You may need at least one dose of MMR if you were born in 1957 or later. You may also need a second dose.  Pneumococcal 13-valent conjugate (PCV13) vaccine. You may need this if you have certain conditions and were not previously vaccinated.  Pneumococcal polysaccharide (PPSV23) vaccine. You may need one or two doses if you smoke cigarettes or if you have certain conditions.  Meningococcal vaccine. You may need this if you have certain conditions.  Hepatitis A vaccine. You may need this if you have certain conditions or if you travel or work in places where you may be exposed to hepatitis A.  Hepatitis B vaccine. You may need this if you have certain conditions or if you travel or work in places where you may be exposed to hepatitis B.  Haemophilus influenzae type b (Hib) vaccine. You may need this if you have certain conditions.  Talk to your health care provider about which screenings and vaccines you need and how often you need them. This information is not intended to replace advice given to you by your health care provider. Make sure you discuss any questions you have with your health care provider. Document Released: 08/01/2015 Document Revised: 03/24/2016 Document Reviewed: 05/06/2015 Elsevier Interactive Patient Education  [employer].

## 2017-12-09 LAB — TSH: TSH: 0.21 u[IU]/mL — ABNORMAL LOW (ref 0.35–4.50)

## 2017-12-09 LAB — LIPID PANEL
CHOL/HDL RATIO: 3
Cholesterol: 182 mg/dL (ref 0–200)
HDL: 58 mg/dL (ref >39.00)
LDL CALC: 103 mg/dL — AB (ref 0–99)
NONHDL: 123.94
TRIGLYCERIDES: 104 mg/dL (ref 0.0–149.0)
VLDL: 20.8 mg/dL (ref 0.0–40.0)

## 2017-12-09 LAB — HEMOGLOBIN A1C: Hgb A1c MFr Bld: 5.2 % (ref 4.6–6.5)

## 2018-01-24 ENCOUNTER — Telehealth: Payer: Self-pay | Admitting: Internal Medicine

## 2018-01-24 NOTE — Telephone Encounter (Signed)
Brittany Moran made f/u appt for 05/12/18. Brittany Moran wants to know if Dr. Elvera LennoxGherghe wants Brittany Moran to have her labs done in advance or at appointment. Please call Brittany Moran at ph# [phone] to advise.

## 2018-01-25 NOTE — Telephone Encounter (Signed)
Please advise 

## 2018-01-25 NOTE — Telephone Encounter (Signed)
I usually check them at the time of the appt

## 2018-01-26 NOTE — Telephone Encounter (Signed)
lft pt vm

## 2018-02-03 ENCOUNTER — Other Ambulatory Visit: Payer: Self-pay | Admitting: Internal Medicine

## 2018-05-12 ENCOUNTER — Ambulatory Visit (INDEPENDENT_AMBULATORY_CARE_PROVIDER_SITE_OTHER): Payer: BLUE CROSS/BLUE SHIELD | Admitting: Internal Medicine

## 2018-05-12 ENCOUNTER — Encounter: Payer: Self-pay | Admitting: Internal Medicine

## 2018-05-12 VITALS — BP 112/70 | HR 56 | Ht 67.75 in | Wt 163.0 lb

## 2018-05-12 DIAGNOSIS — E039 Hypothyroidism, unspecified: Secondary | ICD-10-CM | POA: Diagnosis not present

## 2018-05-12 LAB — TSH: TSH: 1.35 u[IU]/mL (ref 0.35–4.50)

## 2018-05-12 LAB — T4, FREE: Free T4: 1.06 ng/dL (ref 0.60–1.60)

## 2018-05-12 MED ORDER — SYNTHROID 125 MCG PO TABS: ORAL_TABLET | ORAL | 3 refills | Status: AC

## 2018-05-12 NOTE — Patient Instructions (Addendum)
Please continue Synthroid 125 mcg 6/7 days and 62.5 mcg 1/7 days.  Take the thyroid hormone every day, with water, at least 30 minutes before breakfast, separated by at least 4 hours from: - acid reflux medications - calcium - iron - multivitamins  Please stop at the lab.  Please come back for a follow-up appointment in 1 year.

## 2018-05-12 NOTE — Progress Notes (Signed)
Patient ID: Brittany Moran, female   DOB: 06/04/1956, 62 y.o.   MRN: [id number]   HPI  Brittany Moran is a 62 y.o.-year-old female,returning for f/u for postablative hypothyroidism. Last visit 1 year and 4 months ago.  Reviewed history: Pt. has been dx with Graves ds. in 2001 - s/p RAI Tx in 2005. She developed hypothyroidism in 2001; is on Synthroid DAW 125 mcg - saw [facility] >> LT3 added - but she stopped in 10/2013 as her TSH was suppressed.  Pt is on Synthroid d.a.w. 125 mcg 6/7, and 62.5 mcg in 1/7 (116 mg daily), taken: - in am (6 am) - fasting - at least 30 min from b'fast - no Fe, MVI, PPIs - + Calcium after 4h. - not on Biotin  Reviewed patient's TFTs: Lab Results  Component Value Date   TSH 0.21 (L) 12/08/2017   TSH 1.24 12/16/2016   TSH 0.66 11/26/2015   TSH 0.75 11/21/2015   TSH 0.59 12/27/2014   TSH 0.96 04/04/2014   TSH 0.61 02/12/2014   FREET4 1.15 12/16/2016   FREET4 1.14 11/21/2015   FREET4 1.14 12/27/2014   FREET4 1.13 04/04/2014    Pt denies: - feeling nodules in neck - hoarseness - dysphagia - choking - SOB with lying down  She also has a history of an uncharacterized autoimmune disorder - has been investigated extensively, finally dx'ed with fibromyalgia - got better after starting a more plant based diet.  She now cut down carbs >> lost 21 lbs in last year!  She also has MTFHR def. >> started Me-cobalamine, but now stopped for several months.   ROS: Constitutional: + intentional weight loss, no fatigue, no subjective hyperthermia, no subjective hypothermia Eyes: no blurry vision, no xerophthalmia ENT: no sore throat, + see HPI Cardiovascular: no CP/no SOB/no palpitations/no leg swelling Respiratory: no cough/no SOB/no wheezing Gastrointestinal: no N/no V/no D/no C/no acid reflux Musculoskeletal: no muscle aches/no joint aches Skin: no rashes, + hair loss Neurological: no tremors/no numbness/no tingling/no dizziness  I  reviewed pt's medications, allergies, PMH, social hx, family hx, and changes were documented in the history of present illness. Otherwise, unchanged from my initial visit note.  Past Medical History:  Diagnosis Date  . Cardiac arrhythmia   . Chicken pox   . DDD (degenerative disc disease)   . Dry eyes 09/10/2013  . Fibromyalgia 09/10/2013  . Graves disease   . Hypothyroidism, acquired 09/10/2013  . Palpitations 09/10/2013  . Polyarthralgia   . Thyroid disease   . UTI (urinary tract infection)    Past Surgical History:  Procedure Laterality Date  . LEFT HEART CATH AND CORONARY ANGIOGRAPHY N/A 03/10/2017   Procedure: LEFT HEART CATH AND CORONARY ANGIOGRAPHY;  Surgeon: Corky Crafts, MD;  Location: [facility] INVASIVE CV LAB;  Service: Cardiovascular;  Laterality: N/A;  . WISDOM TOOTH EXTRACTION     History   Social History  . Marital Status: Married    Spouse Name: N/A    Number of Children: 2   Social History Main Topics  . Smoking status: Never Smoker   . Smokeless tobacco: Never Used  . Alcohol Use: 0.5 oz/week    1 drink(s) per week     Comment: socially   . 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 betamethasone valerate ointment (VALISONE) 0.1 % Apply a pea sized amount topically BID for 1-2 weeks as needed. Not for long term daily use 15 g 0  . CALCIUM PO Take 1 tablet by mouth daily.     . Cholecalciferol (VITAMIN D-3) 1000 units CAPS Take 1 capsule by mouth daily.    . Misc Natural Products (GLUCOSAMINE CHOND DOUBLE STR PO) Take 2 tablets by mouth daily.    . NONFORMULARY OR  COMPOUNDED ITEM Estrace cream 0.02% insert 1 gram vaginally 2 x a week. 3 month supply 3 refills. 3 each 3  . SYNTHROID 125 MCG tablet TAKE 1 TABLET BY MOUTH EVERY MORNING BEFORE BREAKFAST 90 tablet 0  . vitamin B-12 (CYANOCOBALAMIN) 1000 MCG tablet Take 1,000 mcg by mouth daily.    . nitroGLYCERIN (NITROSTAT) 0.4 MG SL tablet Place 1 tablet (0.4 mg total) under the tongue as needed (before exercise for chest pain). 25 tablet 3   No current facility-administered medications on file prior to visit.    No Known Allergies Family History  Problem Relation Age of Onset  . Arthritis Mother   . Hyperlipidemia Mother   . Hypertension Mother   . Diabetes Mother   . Heart disease Father   . Bipolar disorder Father   . Hypertension Father   . Hyperlipidemia Father   . Schizophrenia Brother   . Sudden death Maternal Grandmother   . Tuberculosis Maternal Grandmother   . Sudden death Paternal Grandfather   . Depression Unknown        brothers and sisters    PE: BP 112/70   Pulse (!) 56   Ht 5' 7.75" (1.721 m)   Wt 163 lb (73.9 kg)   SpO2 99%   BMI 24.97 kg/m  Wt Readings from Last 3 Encounters:  05/12/18 163 lb (73.9 kg)  12/08/17 175 lb (79.4 kg)  07/13/17 184 lb 6.4 oz (83.6 kg)   Constitutional: Slightly overweight, in NAD Eyes: PERRLA, EOMI, no exophthalmos ENT: moist mucous membranes, no thyromegaly, no cervical lymphadenopathy Cardiovascular: RRR, No MRG Respiratory: CTA B Gastrointestinal: abdomen soft, NT, ND, BS+ Musculoskeletal: no deformities, strength intact in all 4 Skin: moist, warm, no rashes Neurological: no tremor with outstretched hands, DTR normal in all 4   ASSESSMENT: 1. Hypothyroidism -Acquired  PLAN:  1. Hypothyroidism - latest thyroid labs reviewed with pt >> TSH was slightly decreased so I advised her to decrease her Synthroid dose slightly - she continues on Synthroid 125 Mcg 6/7 days and 62.5 mcg 1/7 days - equivalent of ~116 mcg daily. - pt feels  good on this dose.  She was able to lose 21 pounds in the last year, about which I can gradually her hich I congratulated her. - we discussed about taking the thyroid hormone every day, with water, >30 minutes before breakfast, separated by >4 hours from acid reflux medications, calcium, iron, multivitamins. Pt. is taking it correctly. - will check thyroid tests today: TSH and fT4 - If labs are abnormal, she will need to return for repeat TFTs in 1.5 months - RTC in 1 year  Needs refills.  - time spent with the patient: 15 minutes, of which >50% was spent in obtaining information about her symptoms, reviewing her previous  labs, evaluations, and treatments, counseling her about her condition (please see the discussed topics above), and developing a plan to further investigate and treat it; she had a number of questions which I addressed.  Office Visit on 05/12/2018  Component Date Value Ref Range Status  . TSH 05/12/2018 1.35  0.35 - 4.50 uIU/mL Final  . Free T4 05/12/2018 1.06  0.60 - 1.60 ng/dL Final   Comment: Specimens from patients who are undergoing biotin therapy and /or ingesting biotin supplements may contain high levels of biotin.  The higher biotin concentration in these specimens interferes with this Free T4 assay.  Specimens that contain high levels  of biotin may cause false high results for this Free T4 assay.  Please interpret results in light of the total clinical presentation of the patient.     We will continue the same dose of Synthroid, since her TFTs are normal.  Carlus Pavlov, MD PhD [facility]

## 2018-06-27 NOTE — Progress Notes (Signed)
Cardiology Office Note   Date:  06/28/2018   ID:  Brittany Moran Calandra, DOB April 14, 1956, MRN [id number]  PCP:  Terressa KoyanagiKim, Hannah R, DO    No chief complaint on file.  Palpitations/chest pain  Wt Readings from Last 3 Encounters:  06/28/18 163 lb (73.9 kg)  05/12/18 163 lb (73.9 kg)  12/08/17 175 lb (79.4 kg)       History of Present Illness: Brittany Moran Silvester is a 62 y.o. female  Who had sx of angina and then had a cath.  THere was some vasospasm on the cath, but no significant CAD.    The left ventricular systolic function is normal.  LV end diastolic pressure is mildly elevated, 18 mm Hg.  The left ventricular ejection fraction is 55-65% by visual estimate.  There is no aortic valve stenosis.  Mild catheter induced spasm of the proximal RCA with catheter engagement.  No angiographically apparent coronary artery disease.     After the cath it was noted, "she continues to have chest discomfort with exertion for the first 60 seconds of the activity, but then it resolves.  She tried Imdur, but could not tolerate it due to headaches. "  She lost 25 lbs from decreasing carbs.  Chest pain has decreased.    Denies : Chest pain. Dizziness. Leg edema. Nitroglycerin use. Orthopnea.  Paroxysmal nocturnal dyspnea. Shortness of breath. Syncope.   She has noticed premature beats, which she had 10 years ago while in [place] during a stressful time. More stress these days.  WOuld like to go back on atenolol.      Past Medical History:  Diagnosis Date  . Cardiac arrhythmia   . Chicken pox   . DDD (degenerative disc disease)   . Dry eyes 09/10/2013  . Fibromyalgia 09/10/2013  . Graves disease   . Hypothyroidism, acquired 09/10/2013  . Palpitations 09/10/2013  . Polyarthralgia   . Thyroid disease   . UTI (urinary tract infection)     Past Surgical History:  Procedure Laterality Date  . LEFT HEART CATH AND CORONARY ANGIOGRAPHY N/A 03/10/2017   Procedure: LEFT HEART CATH AND  CORONARY ANGIOGRAPHY;  Surgeon: Corky CraftsVaranasi, Ekaterini Capitano S, MD;  [facility] LAB;  Service: Cardiovascular;  Laterality: N/A;  . WISDOM TOOTH EXTRACTION       Current Outpatient Medications  Medication Sig Dispense Refill  . CALCIUM PO Take 1 tablet by mouth daily.     . Cholecalciferol (VITAMIN D-3) 1000 units CAPS Take 1 capsule by mouth daily.    . nitroGLYCERIN (NITROSTAT) 0.4 MG SL tablet Place 1 tablet (0.4 mg total) under the tongue as needed (before exercise for chest pain). 25 tablet 3  . NONFORMULARY OR COMPOUNDED ITEM Estrace cream 0.02% insert 1 gram vaginally 2 x a week. 3 month supply 3 refills. 3 each 3  . SYNTHROID 125 MCG tablet TAKE 125 mcg 6 out of 7 days and 62.5 mcg 1 out of 7 days 90 tablet 3  . vitamin B-12 (CYANOCOBALAMIN) 1000 MCG tablet Take 1,000 mcg by mouth daily.     No current facility-administered medications for this visit.     Allergies:   Patient has no known allergies.    Social History:  The patient  reports that she has never smoked. She has never used smokeless tobacco. She reports that she drinks about 1.0 - 2.0 standard drinks of alcohol per week. She reports that she does not use drugs.   Family History:  The patient's family history  includes Arthritis in her mother; Bipolar disorder in her father; Depression in her unknown relative; Diabetes in her mother; Heart disease in her father; Hyperlipidemia in her father and mother; Hypertension in her father and mother; Schizophrenia in her brother; Sudden death in her maternal grandmother and paternal grandfather; Tuberculosis in her maternal grandmother.    ROS:  Please see the history of present illness.   Otherwise, review of systems are positive for premature beats.   All other systems are reviewed and negative.    PHYSICAL EXAM: VS:  BP (!) 134/92   Pulse 82   Ht 5' 7.75" (1.721 m)   Wt 163 lb (73.9 kg)   SpO2 98%   BMI 24.97 kg/m  , BMI Body mass index is 24.97 kg/m. GEN: Well  nourished, well developed, in no acute distress  HEENT: normal  Neck: no JVD, carotid bruits, or masses Cardiac: RRR, premature beats; no murmurs, rubs, or gallops,no edema  Respiratory:  clear to auscultation bilaterally, normal work of breathing GI: soft, nontender, nondistended, + BS MS: no deformity or atrophy  Skin: warm and dry, no rash Neuro:  Strength and sensation are intact Psych: euthymic mood, full affect   EKG:   The ekg ordered today demonstrates NSR, PVCs   Recent Labs: 05/12/2018: TSH 1.35   Lipid Panel    Component Value Date/Time   CHOL 182 12/08/2017 1625   TRIG 104.0 12/08/2017 1625   HDL 58.00 12/08/2017 1625   CHOLHDL 3 12/08/2017 1625   VLDL 20.8 12/08/2017 1625   LDLCALC 103 (H) 12/08/2017 1625     Other studies Reviewed: Additional studies/ records that were reviewed today with results demonstrating: prior cath report reviewed.   ASSESSMENT AND PLAN:  1. Angina : resolved.  Has NTG to use as needed.  Has not needed this for some time. 2. Family h/o CAD: No change since last visit.  Continue preventive therapy.  She is eating healthy.  She has cut out carbs in her diet.  She exercises regularly. 3. PVCs: Symptomatic.  Start atenolol 25 mg daily.  Thyroid is followed with PMD and was ok at last check.  PVCs are likely stress related.  Decrease caffeine intake.     Current medicines are reviewed at length with the patient today.  The patient concerns regarding her medicines were addressed.  The following changes have been made:  Atenolol  Labs/ tests ordered today include:  No orders of the defined types were placed in this encounter.   Recommend 150 minutes/week of aerobic exercise Low fat, low carb, high fiber diet recommended  Disposition:   FU in 1 year   Signed, Lance Muss, MD  06/28/2018 1:53 PM    [facility] [address] [place], [place], [place]  [address] Phone: [phone]; Fax: [phone]

## 2018-06-28 ENCOUNTER — Encounter: Payer: Self-pay | Admitting: Interventional Cardiology

## 2018-06-28 ENCOUNTER — Ambulatory Visit (INDEPENDENT_AMBULATORY_CARE_PROVIDER_SITE_OTHER): Payer: BLUE CROSS/BLUE SHIELD | Admitting: Interventional Cardiology

## 2018-06-28 VITALS — BP 134/92 | HR 82 | Ht 67.75 in | Wt 163.0 lb

## 2018-06-28 DIAGNOSIS — I493 Ventricular premature depolarization: Secondary | ICD-10-CM

## 2018-06-28 DIAGNOSIS — I209 Angina pectoris, unspecified: Secondary | ICD-10-CM | POA: Diagnosis not present

## 2018-06-28 DIAGNOSIS — Z8249 Family history of ischemic heart disease and other diseases of the circulatory system: Secondary | ICD-10-CM

## 2018-06-28 MED ORDER — ATENOLOL 25 MG PO TABS: 25.0000 mg | ORAL_TABLET | Freq: Every day | ORAL | 3 refills | Status: AC | PRN

## 2018-06-28 NOTE — Patient Instructions (Signed)
Medication Instructions:  Your physician has recommended you make the following change in your medication:   START: atenolol 25 mg tablet: Take 1 tablet by mouth daily AS NEEDED for pvcs or palpitations   If you need a refill on your cardiac medications before your next appointment, please call your pharmacy.   Lab work: None Ordered  If you have labs (blood work) drawn today and your tests are completely normal, you will receive your results only by: Marland Kitchen. MyChart Message (if you have MyChart) OR . A paper copy in the mail If you have any lab test that is abnormal or we need to change your treatment, we will call you to review the results.  Testing/Procedures: None ordered  Follow-Up: At [facility], you and your health needs are our priority.  As part of our continuing mission to provide you with exceptional heart care, we have created designated Provider Care Teams.  These Care Teams include your primary Cardiologist (physician) and Advanced Practice Providers (APPs -  Physician Assistants and Nurse Practitioners) who all work together to provide you with the care you need, when you need it. . You will need a follow up appointment in 1 year.  Please call our office 2 months in advance to schedule this appointment.  You may see Everette RankJay Varanasi, MD or one of the following Advanced Practice Providers on your designated Care Team:   . Robbie LisBrittainy Simmons, PA-C . Dayna Dunn, PA-C . Jacolyn ReedyMichele Lenze, PA-C  Any Other Special Instructions Will Be Listed Below (If Applicable).

## 2018-07-27 ENCOUNTER — Ambulatory Visit: Payer: BLUE CROSS/BLUE SHIELD | Admitting: Obstetrics and Gynecology

## 2019-05-18 ENCOUNTER — Ambulatory Visit: Payer: BLUE CROSS/BLUE SHIELD | Admitting: Internal Medicine

## 2019-11-18 ENCOUNTER — Encounter (HOSPITAL_COMMUNITY): Payer: Self-pay

## 2019-11-18 ENCOUNTER — Other Ambulatory Visit: Payer: Self-pay

## 2019-11-18 ENCOUNTER — Ambulatory Visit (HOSPITAL_COMMUNITY)
Admission: EM | Admit: 2019-11-18 | Discharge: 2019-11-18 | Disposition: A | Discharge status: Home / Self Care | Payer: BLUE CROSS/BLUE SHIELD | Attending: Emergency Medicine | Admitting: Emergency Medicine

## 2019-11-18 DIAGNOSIS — S0181XA Laceration without foreign body of other part of head, initial encounter: Secondary | ICD-10-CM

## 2019-11-18 DIAGNOSIS — Z23 Encounter for immunization: Secondary | ICD-10-CM | POA: Diagnosis not present

## 2019-11-18 MED ORDER — TETANUS-DIPHTH-ACELL PERTUSSIS 5-2.5-18.5 LF-MCG/0.5 IM SUSP
INTRAMUSCULAR | Status: AC
  Filled 2019-11-18: qty 0.5

## 2019-11-18 MED ORDER — TETANUS-DIPHTH-ACELL PERTUSSIS 5-2.5-18.5 LF-MCG/0.5 IM SUSP
0.5000 mL | Freq: Once | INTRAMUSCULAR | Status: AC
  Administered 2019-11-18: 18:00:00 0.5 mL via INTRAMUSCULAR

## 2019-11-18 NOTE — ED Triage Notes (Signed)
Pt presents with laceration to the left side of forehead just above eyebrow after slipping and falling in shower .   Pt did not have a LOC

## 2019-11-18 NOTE — Discharge Instructions (Signed)

## 2019-11-18 NOTE — ED Provider Notes (Signed)
[place]    CSN: [id number] Arrival date & time: 11/18/19  1714      History   Chief Complaint Chief Complaint  Patient presents with  . Laceration    HPI Brittany Moran is a 64 y.o. female history of Graves' disease presenting today for evaluation of laceration.  Patient was in the shower slipped on some body wash and hit her head on the toilet.  Sustained laceration above her left eyebrow.  She denies loss of consciousness.  She did have a mild headache immediately after, but this is eased off since the incident happened approximately 1-2 hours ago.  She denies any eye pain with movement or any vision changes.  She denies any significant pain elsewhere.  She denies use of blood thinners.  HPI  Past Medical History:  Diagnosis Date  . Cardiac arrhythmia   . Chicken pox   . DDD (degenerative disc disease)   . Dry eyes 09/10/2013  . Fibromyalgia 09/10/2013  . Graves disease   . Hypothyroidism, acquired 09/10/2013  . Palpitations 09/10/2013  . Polyarthralgia   . Thyroid disease   . UTI (urinary tract infection)     Patient Active Problem List   Diagnosis Date Noted  . Family history of early CAD 05/10/2017  . Abnormal stress test   . Angina pectoris ([place])   . Hypothyroidism, acquired 09/10/2013  . Palpitations 09/10/2013  . Fibromyalgia 09/10/2013  . Dry eyes 09/10/2013    Past Surgical History:  Procedure Laterality Date  . LEFT HEART CATH AND CORONARY ANGIOGRAPHY N/A 03/10/2017   Procedure: LEFT HEART CATH AND CORONARY ANGIOGRAPHY;  Surgeon: Jettie Booze, MD;  Location: [facility];  Service: Cardiovascular;  Laterality: N/A;  . WISDOM TOOTH EXTRACTION      OB History    Gravida  2   Para  2   Term  2   Preterm      AB      Living  2     SAB      TAB      Ectopic      Multiple      Live Births  2            Home Medications    Prior to Admission medications   Medication Sig Start Date End Date Taking?  Authorizing Provider  atenolol (TENORMIN) 25 MG tablet Take 1 tablet (25 mg total) by mouth daily as needed (pvcs, palpitations). 06/28/18 09/26/18  Jettie Booze, MD  CALCIUM PO Take 1 tablet by mouth daily.     [provider]  Cholecalciferol (VITAMIN D-3) 1000 units CAPS Take 1 capsule by mouth daily.    [provider]  nitroGLYCERIN (NITROSTAT) 0.4 MG SL tablet Place 1 tablet (0.4 mg total) under the tongue as needed (before exercise for chest pain). 05/10/17 06/28/18  Jettie Booze, MD  NONFORMULARY OR COMPOUNDED ITEM Estrace cream 0.02% insert 1 gram vaginally 2 x a week. 3 month supply 3 refills. 09/19/17   Salvadore Dom, MD  SYNTHROID 125 MCG tablet TAKE 125 mcg 6 out of 7 days and 62.5 mcg 1 out of 7 days 05/12/18   Philemon Kingdom, MD  vitamin B-12 (CYANOCOBALAMIN) 1000 MCG tablet Take 1,000 mcg by mouth daily.    [provider]    Family History Family History  Problem Relation Age of Onset  . Arthritis Mother   . Hyperlipidemia Mother   . Hypertension Mother   .  Diabetes Mother   . Heart disease Father   . Bipolar disorder Father   . Hypertension Father   . Hyperlipidemia Father   . Schizophrenia Brother   . Sudden death Maternal Grandmother   . Tuberculosis Maternal Grandmother   . Sudden death Paternal Grandfather   . Depression Other        brothers and sisters     Social History Social History   Tobacco Use  . Smoking status: Never Smoker  . Smokeless tobacco: Never Used  Substance Use Topics  . Alcohol use: Yes    Alcohol/week: 1.0 - 2.0 standard drinks    Types: 1 - 2 Standard drinks or equivalent per week    Comment: socially   . Drug use: No     Allergies   Patient has no known allergies.   Review of Systems Review of Systems  Constitutional: Negative for fatigue and fever.  Eyes: Negative for visual disturbance.  Respiratory: Negative for shortness of breath.   Cardiovascular: Negative for  chest pain.  Gastrointestinal: Negative for abdominal pain, nausea and vomiting.  Musculoskeletal: Negative for arthralgias and joint swelling.  Skin: Positive for wound. Negative for color change and rash.  Neurological: Negative for dizziness, weakness, light-headedness and headaches.     Physical Exam Triage Vital Signs ED Triage Vitals  Enc Vitals Group     BP 11/18/19 1755 (!) 157/84     Pulse Rate 11/18/19 1755 75     Resp 11/18/19 1755 18     Temp 11/18/19 1755 98.1 F (36.7 C)     Temp Source 11/18/19 1755 Oral     SpO2 11/18/19 1755 98 %     Weight --      Height --      Head Circumference --      Peak Flow --      Pain Score 11/18/19 1756 6     Pain Loc --      Pain Edu? --      Excl. in GC? --    No data found.  Updated Vital Signs BP (!) 157/84 (BP Location: Right Arm)   Pulse 75   Temp 98.1 F (36.7 C) (Oral)   Resp 18   SpO2 98%   Visual Acuity Right Eye Distance:   Left Eye Distance:   Bilateral Distance:    Right Eye Near:   Left Eye Near:    Bilateral Near:     Physical Exam Vitals and nursing note reviewed.  Constitutional:      Appearance: She is well-developed.     Comments: No acute distress  HENT:     Head: Normocephalic and atraumatic.     Nose: Nose normal.     Mouth/Throat:     Comments: Oral mucosa pink and moist, no tonsillar enlargement or exudate. Posterior pharynx patent and nonerythematous, no uvula deviation or swelling. Normal phonation. Eyes:     Extraocular Movements: Extraocular movements intact.     Conjunctiva/sclera: Conjunctivae normal.     Pupils: Pupils are equal, round, and reactive to light.     Comments: No conjunctival erythema  Left infraorbital area with small area of ecchymosis with overlying tenderness, no underlying crepitus palpated to area or surrounding orbits  Cardiovascular:     Rate and Rhythm: Normal rate.  Pulmonary:     Effort: Pulmonary effort is normal. No respiratory distress.    Abdominal:     General: There is no distension.  Musculoskeletal:  General: Normal range of motion.     Cervical back: Neck supple.  Skin:    General: Skin is warm and dry.     Comments: 3 cm laceration noted above left eyebrow extending approximately 3 mm deep, gaping approximately 3 mm  Neurological:     General: No focal deficit present.     Mental Status: She is alert and oriented to person, place, and time. Mental status is at baseline.     Cranial Nerves: No cranial nerve deficit.     Motor: No weakness.     Gait: Gait normal.      UC Treatments / Results  Labs (all labs ordered are listed, but only abnormal results are displayed) Labs Reviewed - No data to display  EKG   Radiology No results found.  Procedures Laceration Repair  Date/Time: 11/18/2019 7:29 PM Performed by: Emmersyn Kratzke, [place] C, PA-C Authorized by: Cuahutemoc Attar, Junius Creamer, PA-C   Consent:    Consent obtained:  Verbal   Consent given by:  Patient   Risks discussed:  Pain and poor cosmetic result Anesthesia (see MAR for exact dosages):    Anesthesia method:  Local infiltration   Local anesthetic:  Lidocaine 2% WITH epi Laceration details:    Location:  Face   Face location:  L eyebrow   Length (cm):  3   Depth (mm):  3 Repair type:    Repair type:  Simple Pre-procedure details:    Preparation:  Patient was prepped and draped in usual sterile fashion Exploration:    Hemostasis achieved with:  Direct pressure and epinephrine   Wound exploration: wound explored through full range of motion     Wound extent: no foreign bodies/material noted, no muscle damage noted and no underlying fracture noted     Contaminated: no   Treatment:    Area cleansed with:  Shur-Clens   Amount of cleaning:  Standard   Irrigation solution:  Sterile water   Irrigation volume:  250   Irrigation method:  Syringe   Visualized foreign bodies/material removed: no   Skin repair:    Repair method:  Sutures   Suture  size:  6-0   Suture material:  Prolene   Suture technique:  Simple interrupted   Number of sutures:  6 Approximation:    Approximation:  Loose Post-procedure details:    Dressing:  Open (no dressing)   Patient tolerance of procedure:  Tolerated well, no immediate complications   (including critical care time)  Medications Ordered in UC Medications  Tdap (BOOSTRIX) injection 0.5 mL (0.5 mLs Intramuscular Given 11/18/19 1824)    Initial Impression / Assessment and Plan / UC Course  I have reviewed the triage vital signs and the nursing notes.  Pertinent labs & imaging results that were available during my care of the patient were reviewed by me and considered in my medical decision making (see chart for details).     Wound repaired, discussed wound care.  Sutures to be removed in approximately 1 week.  Monitor for any signs of infection,Discussed strict return precautions. Patient verbalized understanding and is agreeable with plan.  Final Clinical Impressions(s) / UC Diagnoses   Final diagnoses:  Facial laceration, initial encounter     Discharge Instructions     WOUND CARE Please return in 7 days to have your stitches/staples removed or sooner if you have concerns. Marland Kitchen Keep area clean and dry for 24 hours. Do not remove bandage, if applied. . After 24 hours, remove bandage and  wash wound gently with mild soap and warm water. Reapply a new bandage after cleaning wound, if directed. . Continue daily cleansing with soap and water until stitches/staples are removed. . Do not apply any ointments or creams to the wound while stitches/staples are in place, as this may cause delayed healing. . Notify the office if you experience any of the following signs of infection: Swelling, redness, pus drainage, streaking, fever >101.0 F . Notify the office if you experience excessive bleeding that does not stop after 15-20 minutes of constant, firm pressure.   ED Prescriptions    None      PDMP not reviewed this encounter.   Lew Dawes, [place] 11/18/19 1931
